# Patient Record
Sex: Female | Born: 1940 | Race: White | Hispanic: No | Smoking: Never smoker
Health system: Southern US, Community
[De-identification: ages and names within clinical notes are randomized; demographics above are authoritative.]

## PROBLEM LIST (undated history)

## (undated) DIAGNOSIS — I10 Essential (primary) hypertension: Secondary | ICD-10-CM

## (undated) DIAGNOSIS — G3184 Mild cognitive impairment, so stated: Secondary | ICD-10-CM

## (undated) DIAGNOSIS — F419 Anxiety disorder, unspecified: Secondary | ICD-10-CM

## (undated) DIAGNOSIS — E785 Hyperlipidemia, unspecified: Secondary | ICD-10-CM

## (undated) HISTORY — PX: BACK SURGERY: SHX140

## (undated) HISTORY — PX: ABDOMINAL HYSTERECTOMY: SHX81

## (undated) HISTORY — PX: CHOLECYSTECTOMY: SHX55

---

## 2012-01-23 ENCOUNTER — Emergency Department (INDEPENDENT_AMBULATORY_CARE_PROVIDER_SITE_OTHER): Payer: Medicare Other

## 2012-01-23 ENCOUNTER — Emergency Department (INDEPENDENT_AMBULATORY_CARE_PROVIDER_SITE_OTHER)
Admission: EM | Admit: 2012-01-23 | Discharge: 2012-01-23 | Disposition: A | Discharge status: Home / Self Care | Payer: Medicare Other | Source: Home / Self Care

## 2012-01-23 DIAGNOSIS — F419 Anxiety disorder, unspecified: Secondary | ICD-10-CM | POA: Insufficient documentation

## 2012-01-23 DIAGNOSIS — R05 Cough: Secondary | ICD-10-CM

## 2012-01-23 DIAGNOSIS — J40 Bronchitis, not specified as acute or chronic: Secondary | ICD-10-CM

## 2012-01-23 DIAGNOSIS — I152 Hypertension secondary to endocrine disorders: Secondary | ICD-10-CM | POA: Insufficient documentation

## 2012-01-23 DIAGNOSIS — J069 Acute upper respiratory infection, unspecified: Secondary | ICD-10-CM

## 2012-01-23 DIAGNOSIS — R062 Wheezing: Secondary | ICD-10-CM

## 2012-01-23 DIAGNOSIS — E785 Hyperlipidemia, unspecified: Secondary | ICD-10-CM | POA: Insufficient documentation

## 2012-01-23 DIAGNOSIS — I10 Essential (primary) hypertension: Secondary | ICD-10-CM | POA: Insufficient documentation

## 2012-01-23 HISTORY — DX: Hyperlipidemia, unspecified: E78.5

## 2012-01-23 HISTORY — DX: Essential (primary) hypertension: I10

## 2012-01-23 HISTORY — DX: Anxiety disorder, unspecified: F41.9

## 2012-01-23 MED ORDER — HYDROCOD POLST-CHLORPHEN POLST 10-8 MG/5ML PO LQCR: 5.0000 mL | Freq: Two times a day (BID) | ORAL | Status: DC | PRN

## 2012-01-23 MED ORDER — AZITHROMYCIN 250 MG PO TABS: ORAL_TABLET | ORAL | Status: DC

## 2012-01-23 MED ORDER — METHYLPREDNISOLONE ACETATE 80 MG/ML IJ SUSP
80.0000 mg | Freq: Once | INTRAMUSCULAR | Status: AC
  Administered 2012-01-23: 80 mg via INTRAMUSCULAR

## 2012-01-23 NOTE — ED Notes (Signed)
Kimberly Hines complains of a dry cough with some nasal congestion for 4 days. Denies fever, chills or sweats.

## 2012-01-23 NOTE — ED Provider Notes (Addendum)
History     CSN: [id number]  Arrival date & time 01/23/12  1545   None     Chief Complaint  Patient presents with  . Cough    x 4 days   HPI URI Symptoms Onset: 4 days Description: cough, post nasal drip, intermittent SOB Modifying factors:  Pt is having colonoscopy in 2 days. Is wondering if cough may affect procedure.   Symptoms Nasal discharge: post nasal drip Fever: no Sore throat: no Cough: yes Wheezing: yes; intermittent  Ear pain: no GI symptoms: no Sick contacts: unkown  Red Flags  Stiff neck: no Dyspnea: faint, exertional  Rash: no Swallowing difficulty: no  Sinusitis Risk Factors Headache/face pain: no Double sickening: no tooth pain: no  Allergy Risk Factors Sneezing: no Itchy scratchy throat: no Seasonal symptoms: no  Flu Risk Factors Headache: no muscle aches: no severe fatigue: no   Past Medical History  Diagnosis Date  . Hyperlipidemia   . Hypertension   . Anxiety     Past Surgical History  Procedure Date  . Abdominal hysterectomy   . Cholecystectomy     Family History  Problem Relation Age of Onset  . Alzheimer's disease Mother   . Cancer Father     Education officer, environmental    History  Substance Use Topics  . Smoking status: Never Smoker   . Smokeless tobacco: Never Used  . Alcohol Use: No    OB History    Grav Para Term Preterm Abortions TAB SAB Ect Mult Living                  Review of Systems  All other systems reviewed and are negative.    Allergies  Benicar  Home Medications   Current Outpatient Rx  Name Route Sig Dispense Refill  . AMLODIPINE BESYLATE 10 MG PO TABS Oral Take 10 mg by mouth daily.    Marland Kitchen CITALOPRAM HYDROBROMIDE 20 MG PO TABS Oral Take 20 mg by mouth daily.    Marland Kitchen ROSUVASTATIN CALCIUM 5 MG PO TABS Oral Take 5 mg by mouth daily.      BP 164/82  Pulse 75  Temp 98.3 F (36.8 C) (Oral)  Resp 16  Ht 5\' 2"  (1.575 m)  Wt 160 lb (72.576 kg)  BMI 29.26 kg/m2  SpO2 99%  Physical Exam    Constitutional: She appears well-developed and well-nourished.  HENT:  Head: Normocephalic and atraumatic.  Right Ear: External ear normal.  Left Ear: External ear normal.       +mild nasal erythema, rhinorrhea bilaterally, + post oropharyngeal erythema    Eyes: Conjunctivae are normal. Pupils are equal, round, and reactive to light.  Neck: Normal range of motion. Neck supple.  Cardiovascular: Normal rate and regular rhythm.   Pulmonary/Chest: Effort normal and breath sounds normal. She has no wheezes. She has no rales.  Abdominal: Soft.  Musculoskeletal: Normal range of motion.  Neurological: She is alert.  Skin: Skin is warm.    ED Course  Procedures   Labs Reviewed - No data to display Dg Chest 2 View  01/23/2012  *RADIOLOGY REPORT*  Clinical Data: Cough for the past 4 days.  CHEST - 2 VIEW  Comparison: None.  Findings: Normal sized heart.  Clear lungs.  The lungs are mildly hyperexpanded with mild central peribronchial thickening.  Thoracic spine degenerative changes.  Cholecystectomy clips.  IMPRESSION: Mild changes of COPD and chronic bronchitis.   Original Report Authenticated By: Darrol Angel, M.D.  1. URI (upper respiratory infection)   2. Bronchitis   3. Wheezing symptom       MDM  Suspect likely viral process as underlying etiology  Given intermittent faint wheezing; depomedrol 80mg  IM x 1 today. No wheezing on my lung exam today.  Prophylactic Rx for azithromycin given. Use if sxs not improved in 5-7 days.  Tussionex for cough.  Broached issued of colonoscopy. Told her to let staff know of sxs on day of procedure. Decision can be made at that time.  Infectious and respiratory red flags reviewed.  Handout deferred by pt.  Follow up as needed.      The patient and/or caregiver has been counseled thoroughly with regard to treatment plan and/or medications prescribed including dosage, schedule, interactions, rationale for use, and possible side effects and  they verbalize understanding. Diagnoses and expected course of recovery discussed and will return if not improved as expected or if the condition worsens. Patient and/or caregiver verbalized understanding.              Doree Albee, MD 01/23/12 1707  Doree Albee, MD 01/23/12 [phone]

## 2013-12-14 DIAGNOSIS — M5137 Other intervertebral disc degeneration, lumbosacral region: Secondary | ICD-10-CM | POA: Insufficient documentation

## 2013-12-14 DIAGNOSIS — M8580 Other specified disorders of bone density and structure, unspecified site: Secondary | ICD-10-CM | POA: Insufficient documentation

## 2013-12-14 DIAGNOSIS — M5430 Sciatica, unspecified side: Secondary | ICD-10-CM | POA: Insufficient documentation

## 2013-12-14 DIAGNOSIS — J45909 Unspecified asthma, uncomplicated: Secondary | ICD-10-CM | POA: Insufficient documentation

## 2013-12-14 DIAGNOSIS — R7303 Prediabetes: Secondary | ICD-10-CM | POA: Insufficient documentation

## 2013-12-20 DIAGNOSIS — F419 Anxiety disorder, unspecified: Secondary | ICD-10-CM | POA: Insufficient documentation

## 2014-08-06 DIAGNOSIS — H9191 Unspecified hearing loss, right ear: Secondary | ICD-10-CM | POA: Insufficient documentation

## 2015-01-28 DIAGNOSIS — Z13820 Encounter for screening for osteoporosis: Secondary | ICD-10-CM | POA: Insufficient documentation

## 2015-08-03 DIAGNOSIS — Z86010 Personal history of colonic polyps: Secondary | ICD-10-CM | POA: Insufficient documentation

## 2017-05-26 DIAGNOSIS — E119 Type 2 diabetes mellitus without complications: Secondary | ICD-10-CM

## 2017-05-26 HISTORY — DX: Type 2 diabetes mellitus without complications: E11.9

## 2017-08-19 DIAGNOSIS — M5416 Radiculopathy, lumbar region: Secondary | ICD-10-CM | POA: Insufficient documentation

## 2017-11-15 DIAGNOSIS — Z981 Arthrodesis status: Secondary | ICD-10-CM | POA: Insufficient documentation

## 2017-12-02 DIAGNOSIS — R7401 Elevation of levels of liver transaminase levels: Secondary | ICD-10-CM | POA: Insufficient documentation

## 2018-06-05 DIAGNOSIS — Z9189 Other specified personal risk factors, not elsewhere classified: Secondary | ICD-10-CM | POA: Insufficient documentation

## 2018-06-05 DIAGNOSIS — Z789 Other specified health status: Secondary | ICD-10-CM | POA: Insufficient documentation

## 2018-06-05 DIAGNOSIS — E538 Deficiency of other specified B group vitamins: Secondary | ICD-10-CM | POA: Insufficient documentation

## 2018-09-08 DIAGNOSIS — G3184 Mild cognitive impairment, so stated: Secondary | ICD-10-CM | POA: Insufficient documentation

## 2018-12-21 DIAGNOSIS — R748 Abnormal levels of other serum enzymes: Secondary | ICD-10-CM | POA: Insufficient documentation

## 2019-04-23 DIAGNOSIS — R059 Cough, unspecified: Secondary | ICD-10-CM | POA: Insufficient documentation

## 2019-04-23 DIAGNOSIS — J069 Acute upper respiratory infection, unspecified: Secondary | ICD-10-CM | POA: Insufficient documentation

## 2021-04-24 ENCOUNTER — Telehealth: Payer: Self-pay | Admitting: Physician Assistant

## 2021-04-24 ENCOUNTER — Other Ambulatory Visit: Payer: Self-pay | Admitting: Physician Assistant

## 2021-04-24 ENCOUNTER — Emergency Department (HOSPITAL_COMMUNITY): Payer: Medicare Other

## 2021-04-24 ENCOUNTER — Observation Stay: Payer: Medicare Other

## 2021-04-24 ENCOUNTER — Encounter (HOSPITAL_COMMUNITY): Payer: Self-pay

## 2021-04-24 ENCOUNTER — Observation Stay (HOSPITAL_COMMUNITY): Payer: Medicare Other

## 2021-04-24 ENCOUNTER — Observation Stay (HOSPITAL_COMMUNITY)
Admission: EM | Admit: 2021-04-24 | Discharge: 2021-04-24 | Disposition: A | Discharge status: Home / Self Care | Payer: Medicare Other | Attending: Internal Medicine | Admitting: Internal Medicine

## 2021-04-24 DIAGNOSIS — Z79899 Other long term (current) drug therapy: Secondary | ICD-10-CM | POA: Diagnosis not present

## 2021-04-24 DIAGNOSIS — I10 Essential (primary) hypertension: Secondary | ICD-10-CM | POA: Insufficient documentation

## 2021-04-24 DIAGNOSIS — S0181XA Laceration without foreign body of other part of head, initial encounter: Principal | ICD-10-CM | POA: Insufficient documentation

## 2021-04-24 DIAGNOSIS — I480 Paroxysmal atrial fibrillation: Secondary | ICD-10-CM

## 2021-04-24 DIAGNOSIS — R55 Syncope and collapse: Secondary | ICD-10-CM | POA: Diagnosis present

## 2021-04-24 DIAGNOSIS — E876 Hypokalemia: Secondary | ICD-10-CM | POA: Diagnosis not present

## 2021-04-24 DIAGNOSIS — E119 Type 2 diabetes mellitus without complications: Secondary | ICD-10-CM

## 2021-04-24 DIAGNOSIS — S0990XA Unspecified injury of head, initial encounter: Secondary | ICD-10-CM | POA: Diagnosis present

## 2021-04-24 DIAGNOSIS — Z23 Encounter for immunization: Secondary | ICD-10-CM | POA: Insufficient documentation

## 2021-04-24 DIAGNOSIS — W19XXXA Unspecified fall, initial encounter: Secondary | ICD-10-CM | POA: Insufficient documentation

## 2021-04-24 DIAGNOSIS — R9431 Abnormal electrocardiogram [ECG] [EKG]: Secondary | ICD-10-CM | POA: Diagnosis present

## 2021-04-24 DIAGNOSIS — Z20822 Contact with and (suspected) exposure to covid-19: Secondary | ICD-10-CM | POA: Insufficient documentation

## 2021-04-24 DIAGNOSIS — Z91040 Latex allergy status: Secondary | ICD-10-CM | POA: Diagnosis not present

## 2021-04-24 DIAGNOSIS — E785 Hyperlipidemia, unspecified: Secondary | ICD-10-CM | POA: Diagnosis present

## 2021-04-24 DIAGNOSIS — Z7984 Long term (current) use of oral hypoglycemic drugs: Secondary | ICD-10-CM | POA: Diagnosis not present

## 2021-04-24 DIAGNOSIS — I4891 Unspecified atrial fibrillation: Secondary | ICD-10-CM | POA: Diagnosis not present

## 2021-04-24 DIAGNOSIS — I152 Hypertension secondary to endocrine disorders: Secondary | ICD-10-CM | POA: Diagnosis present

## 2021-04-24 HISTORY — DX: Mild cognitive impairment of uncertain or unknown etiology: G31.84

## 2021-04-24 LAB — COMPREHENSIVE METABOLIC PANEL
ALT: 13 U/L (ref 0–44)
AST: 14 U/L — ABNORMAL LOW (ref 15–41)
Albumin: 3.7 g/dL (ref 3.5–5.0)
Alkaline Phosphatase: 75 U/L (ref 38–126)
Anion gap: 10 (ref 5–15)
BUN: 16 mg/dL (ref 8–23)
CO2: 19 mmol/L — ABNORMAL LOW (ref 22–32)
Calcium: 8.5 mg/dL — ABNORMAL LOW (ref 8.9–10.3)
Chloride: 107 mmol/L (ref 98–111)
Creatinine, Ser: 0.85 mg/dL (ref 0.44–1.00)
GFR, Estimated: >60 mL/min (ref >60)
Glucose, Bld: 166 mg/dL — ABNORMAL HIGH (ref 70–99)
Potassium: 2.6 mmol/L — CL (ref 3.5–5.1)
Sodium: 136 mmol/L (ref 135–145)
Total Bilirubin: 0.5 mg/dL (ref 0.3–1.2)
Total Protein: 6.7 g/dL (ref 6.5–8.1)

## 2021-04-24 LAB — CBC WITH DIFFERENTIAL/PLATELET
Abs Immature Granulocytes: 0.04 10*3/uL (ref 0.00–0.07)
Basophils Absolute: 0.1 10*3/uL (ref 0.0–0.1)
Basophils Relative: 1 %
Eosinophils Absolute: 0.1 10*3/uL (ref 0.0–0.5)
Eosinophils Relative: 1 %
HCT: 44 % (ref 36.0–46.0)
Hemoglobin: 14.2 g/dL (ref 12.0–15.0)
Immature Granulocytes: 0 %
Lymphocytes Relative: 21 %
Lymphs Abs: 1.9 10*3/uL (ref 0.7–4.0)
MCH: 29.5 pg (ref 26.0–34.0)
MCHC: 32.3 g/dL (ref 30.0–36.0)
MCV: 91.5 fL (ref 80.0–100.0)
Monocytes Absolute: 0.7 10*3/uL (ref 0.1–1.0)
Monocytes Relative: 8 %
Neutro Abs: 6.3 10*3/uL (ref 1.7–7.7)
Neutrophils Relative %: 69 %
Platelets: 241 10*3/uL (ref 150–400)
RBC: 4.81 MIL/uL (ref 3.87–5.11)
RDW: 13.4 % (ref 11.5–15.5)
WBC: 9.1 10*3/uL (ref 4.0–10.5)
nRBC: 0 % (ref 0.0–0.2)

## 2021-04-24 LAB — URINALYSIS, ROUTINE W REFLEX MICROSCOPIC
Bilirubin Urine: NEGATIVE
Glucose, UA: NEGATIVE mg/dL
Hgb urine dipstick: NEGATIVE
Ketones, ur: NEGATIVE mg/dL
Leukocytes,Ua: NEGATIVE
Nitrite: NEGATIVE
Protein, ur: NEGATIVE mg/dL
Specific Gravity, Urine: 1.017 (ref 1.005–1.030)
pH: 6 (ref 5.0–8.0)

## 2021-04-24 LAB — TROPONIN I (HIGH SENSITIVITY): Troponin I (High Sensitivity): 3 ng/L (ref <18)

## 2021-04-24 LAB — RESP PANEL BY RT-PCR (FLU A&B, COVID) ARPGX2
Influenza A by PCR: NEGATIVE
Influenza B by PCR: NEGATIVE
SARS Coronavirus 2 by RT PCR: NEGATIVE

## 2021-04-24 LAB — T4, FREE: Free T4: 1.27 ng/dL — ABNORMAL HIGH (ref 0.61–1.12)

## 2021-04-24 LAB — CBG MONITORING, ED: Glucose-Capillary: 174 mg/dL — ABNORMAL HIGH (ref 70–99)

## 2021-04-24 LAB — MAGNESIUM: Magnesium: 2.2 mg/dL (ref 1.7–2.4)

## 2021-04-24 LAB — TSH: TSH: 2.507 u[IU]/mL (ref 0.350–4.500)

## 2021-04-24 MED ORDER — SODIUM CHLORIDE 0.9 % IV BOLUS
1000.0000 mL | Freq: Once | INTRAVENOUS | Status: AC
  Administered 2021-04-24: 1000 mL via INTRAVENOUS

## 2021-04-24 MED ORDER — ACETAMINOPHEN 325 MG PO TABS: 650.0000 mg | ORAL_TABLET | Freq: Four times a day (QID) | ORAL | Status: DC | PRN

## 2021-04-24 MED ORDER — IOHEXOL 350 MG/ML SOLN
80.0000 mL | Freq: Once | INTRAVENOUS | Status: AC | PRN
  Administered 2021-04-24: 80 mL via INTRAVENOUS

## 2021-04-24 MED ORDER — ACETAMINOPHEN 650 MG RE SUPP: 650.0000 mg | Freq: Four times a day (QID) | RECTAL | Status: DC | PRN

## 2021-04-24 MED ORDER — POTASSIUM CHLORIDE 10 MEQ/100ML IV SOLN
10.0000 meq | INTRAVENOUS | Status: AC
  Administered 2021-04-24 (×2): 10 meq via INTRAVENOUS
  Filled 2021-04-24 (×2): qty 100

## 2021-04-24 MED ORDER — APIXABAN 5 MG PO TABS: 5.0000 mg | ORAL_TABLET | Freq: Two times a day (BID) | ORAL | 0 refills | Status: DC

## 2021-04-24 MED ORDER — ENOXAPARIN SODIUM 40 MG/0.4ML IJ SOSY: 40.0000 mg | PREFILLED_SYRINGE | INTRAMUSCULAR | Status: DC

## 2021-04-24 MED ORDER — ONDANSETRON HCL 4 MG/2ML IJ SOLN
4.0000 mg | Freq: Once | INTRAMUSCULAR | Status: AC
  Administered 2021-04-24: 4 mg via INTRAVENOUS
  Filled 2021-04-24: qty 2

## 2021-04-24 MED ORDER — TETANUS-DIPHTH-ACELL PERTUSSIS 5-2.5-18.5 LF-MCG/0.5 IM SUSY
0.5000 mL | PREFILLED_SYRINGE | Freq: Once | INTRAMUSCULAR | Status: AC
  Administered 2021-04-24: 0.5 mL via INTRAMUSCULAR
  Filled 2021-04-24: qty 0.5

## 2021-04-24 MED ORDER — DILTIAZEM HCL 25 MG/5ML IV SOLN
10.0000 mg | Freq: Once | INTRAVENOUS | Status: AC
  Administered 2021-04-24: 10 mg via INTRAVENOUS
  Filled 2021-04-24: qty 5

## 2021-04-24 MED ORDER — APIXABAN 5 MG PO TABS
5.0000 mg | ORAL_TABLET | Freq: Two times a day (BID) | ORAL | Status: DC
  Filled 2021-04-24: qty 1

## 2021-04-24 MED ORDER — POTASSIUM CHLORIDE CRYS ER 20 MEQ PO TBCR
40.0000 meq | EXTENDED_RELEASE_TABLET | Freq: Once | ORAL | Status: AC
  Administered 2021-04-24: 40 meq via ORAL
  Filled 2021-04-24: qty 2

## 2021-04-24 MED ORDER — POTASSIUM CHLORIDE IN NACL 40-0.9 MEQ/L-% IV SOLN
INTRAVENOUS | Status: DC
  Filled 2021-04-24: qty 1000

## 2021-04-24 MED ORDER — MAGNESIUM SULFATE 2 GM/50ML IV SOLN
2.0000 g | Freq: Once | INTRAVENOUS | Status: AC
  Administered 2021-04-24: 2 g via INTRAVENOUS
  Filled 2021-04-24: qty 50

## 2021-04-24 NOTE — Consult Note (Addendum)
Cardiology Consultation:   Patient ID: Kimberly Hines MRN: [id number]; DOB: 1940/10/30  Admit date: 04/24/2021 Date of Consult: 04/24/2021  PCP:  Lenox Ponds, MD   [facility] Providers Cardiologist:  New to cardiology Click here to update MD or APP on Care Team, Refresh:1}     Patient Profile:   Kimberly Hines is a 80 y.o. female with a hx of HTN, HLD, mild cognitive impairment, anxiety who is being seen 04/24/2021 for the evaluation of atrial fibrillation at the request of Dr. Robb Matar.  History of Present Illness:   Ms. Diprima has no prior cardiac history. She did have an ER visit at [facility]SH 10/2020 for nausea, vomiting, diarrhea, found to be hypokalemic. CareEverywhere says EKG showed NSR (do not have tracing for review).  Today she was at the hospital in the surgical waiting room with her husband because her son was here having hip surgery. She was feeling completely normal without any recent cardiac or GI symptoms. Had been eating and drinking normally. She went to stand up to go to the bathroom, was a little wobbly, and had to sit back down. She then went to the bathroom and while coming out, fell and struck her head. Her husband did not see the fall but came to her aid as soon as he realized. She does not remember anything right before the fall. She said she was completely awake when she hit the ground and was alert when her husband came to her side. She sustained a laceration to the left eyebrow. She had nausea and vomiting thereafter. In triage she was alert but slightly confused. She was found to be in rapid atrial fibrillation with prolonged QT interval and diffuse STT changes, with no prior EKGs to compare to. Initial BP was 140/86. Labs showed severe hypokalemia of 2.6, otherwise normal Mg, Cr, CBC, TSH, and troponin. Covid/flu negative. CXR showed minimal bibasilar atelectasis. CT head showed no acute fractures or hemorrhage. CT angio showed no evidence of PE, + mild  pulmonary edema in the lung bases with trace bilateral pleural effusions and bibasilar atelectasis.  She was treated with IV fluids, empiric IV magnesium, potassium, Zofran and 10mg  of diltiazem. She has since converted to NSR without significant post-conversion pauses or bradycardia. She currently feels well without complaint and denies any preceding cardiac symptoms before this admission.  Past Medical History:  Diagnosis Date   Anxiety    Hyperlipidemia    Hypertension    Mild cognitive impairment     Past Surgical History:  Procedure Laterality Date   ABDOMINAL HYSTERECTOMY     CHOLECYSTECTOMY       Home Medications:  Prior to Admission medications   Medication Sig Start Date End Date Taking? Authorizing Provider  amLODipine (NORVASC) 10 MG tablet Take 10 mg by mouth daily.    [provider]  azithromycin (ZITHROMAX) 250 MG tablet Take 2 tabs PO x 1 dose, then 1 tab PO QD x 4 days. USE IF SYMPTOMS NOT IMPROVED IN 5-7 DAYS 01/23/12   03/24/12, MD  citalopram (CELEXA) 20 MG tablet Take 20 mg by mouth daily.    [provider]  rosuvastatin (CRESTOR) 5 MG tablet Take 5 mg by mouth daily.    [provider]    Inpatient Medications: Scheduled Meds:  Continuous Infusions:  potassium chloride 10 mEq (04/24/21 1138)   PRN Meds: REM  Allergies:    Allergies  Allergen Reactions   Benicar [Olmesartan] Anaphylaxis and Nausea And  Vomiting   Diazepam Anaphylaxis   Latex Rash   Tape Rash    ACE BANDAGES    Social History:   Social History   Socioeconomic History   Marital status: Married    Spouse name: Not on file   Number of children: Not on file   Years of education: Not on file   Highest education level: Not on file  Occupational History   Not on file  Tobacco Use   Smoking status: Never   Smokeless tobacco: Never  Substance and Sexual Activity   Alcohol use: No   Drug use: No   Sexual activity: Not on file  Other Topics  Concern   Not on file  Social History Narrative   Not on file   Social Determinants of Health   Financial Resource Strain: Not on file  Food Insecurity: Not on file  Transportation Needs: Not on file  Physical Activity: Not on file  Stress: Not on file  Social Connections: Not on file  Intimate Partner Violence: Not on file    Family History:   Family History  Problem Relation Age of Onset   Alzheimer's disease Mother    Cancer Father        [place] Deputy worker   Coronary artery disease Paternal Aunt      ROS:  Please see the history of present illness.   All other ROS reviewed and negative.     Physical Exam/Data:   Vitals:   04/24/21 0845 04/24/21 0930 04/24/21 0945 04/24/21 1030  BP: 122/76 (!) 112/99 (!) 103/50 120/69  Pulse: (!) 136 (!) 114 (!) 113 70  Resp: 17 (!) 22 17 14   Temp:      TempSrc:      SpO2: 90% (!) 79% (!) 73% 94%   No intake or output data in the 24 hours ending 04/24/21 1219 Last 3 Weights 01/23/2012  Weight (lbs) 160 lb  Weight (kg) 72.576 kg     There is no height or weight on file to calculate BMI.  General: Well developed, well nourished WF, in no acute distress. Head: Normocephalic, sclera non-icteric, no xanthomas, nares are without discharge. S/p lac repair left eyebrow with mild ecchymosis Neck: Negative for carotid bruits. JVP not elevated. Lungs: Clear bilaterally to auscultation without wheezes, rales, or rhonchi. Breathing is unlabored. Heart: RRR S1 S2 without murmurs, rubs, or gallops.  Abdomen: Soft, non-tender, non-distended with normoactive bowel sounds. No rebound/guarding. Extremities: No clubbing or cyanosis. No edema. Distal pedal pulses are 2+ and equal bilaterally. Neuro: Alert and oriented X 3. Moves all extremities spontaneously. Psych:  Responds to questions appropriately with a normal affect.   EKG:  The EKG was personally reviewed and demonstrates:  atrial fib 113bpm, diffuse STT changes, QT prolongation  577ms  Telemetry:  Telemetry was personally reviewed and demonstrates:  atrial fib then NSR  Relevant CV Studies: None  Laboratory Data:  High Sensitivity Troponin:   Recent Labs  Lab 04/24/21 0836  TROPONINIHS 3     Chemistry Recent Labs  Lab 04/24/21 0836  NA 136  K 2.6*  CL 107  CO2 19*  GLUCOSE 166*  BUN 16  CREATININE 0.85  CALCIUM 8.5*  MG 2.2  GFRNONAA >60  ANIONGAP 10    Recent Labs  Lab 04/24/21 0836  PROT 6.7  ALBUMIN 3.7  AST 14*  ALT 13  ALKPHOS 75  BILITOT 0.5   Lipids No results for input(s): CHOL, TRIG, HDL, LABVLDL, LDLCALC, CHOLHDL in the last  168 hours.  Hematology Recent Labs  Lab 04/24/21 0836  WBC 9.1  RBC 4.81  HGB 14.2  HCT 44.0  MCV 91.5  MCH 29.5  MCHC 32.3  RDW 13.4  PLT 241   Thyroid  Recent Labs  Lab 04/24/21 0836  TSH 2.507    BNPNo results for input(s): BNP, PROBNP in the last 168 hours.  DDimer No results for input(s): DDIMER in the last 168 hours.   Radiology/Studies:  CT HEAD WO CONTRAST (5MM)  Result Date: 04/24/2021 CLINICAL DATA:  Fall, laceration above left eyebrow EXAM: CT HEAD WITHOUT CONTRAST CT CERVICAL SPINE WITHOUT CONTRAST TECHNIQUE: Multidetector CT imaging of the head and cervical spine was performed following the standard protocol without intravenous contrast. Multiplanar CT image reconstructions of the cervical spine were also generated. COMPARISON:  Brain MRI 12/15/2020 FINDINGS: CT HEAD FINDINGS Brain: There is no evidence of acute intracranial hemorrhage, extra-axial fluid collection, or acute infarct. There is mild global parenchymal volume loss. The ventricles are stable in size. Patchy hypodensity in the subcortical and periventricular white matter likely reflects sequela of chronic white matter microangiopathy. There is no solid mass lesion. There is no midline shift. Vascular: No hyperdense vessel or unexpected calcification. Skull: Normal. Negative for fracture or focal lesion.  Sinuses/Orbits: The imaged paranasal sinuses are clear. Bilateral lens implants are in place. The globes and orbits are otherwise unremarkable. Other: None. CT CERVICAL SPINE FINDINGS Alignment: Normal. Skull base and vertebrae: Skull base alignment is maintained. Vertebral body heights are preserved. There is no evidence of acute fracture. Soft tissues and spinal canal: No prevertebral fluid or swelling. No visible canal hematoma. Disc levels: There is multilevel intervertebral disc space narrowing with associated degenerative endplate change, most advanced at C5-C6 and C6-C7. There is relatively mild multilevel facet arthropathy. There is severe bilateral neural foraminal stenosis at C3-C4, on the right at C4-C5, and on the left at C5-C6 and C6-C7. Upper chest: There is mild scarring in the lung apices. Other: None. IMPRESSION: 1. No acute intracranial hemorrhage or calvarial fracture. 2. No acute fracture or traumatic malalignment of the cervical spine. 3. 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 Electronically Signed   By: Valetta Mole M.D.   On: 04/24/2021 09:30   CT Angio Chest PE W and/or Wo Contrast  Result Date: 04/24/2021 CLINICAL DATA:  Pulmonary embolism, rule out EXAM: CT ANGIOGRAPHY CHEST WITH CONTRAST TECHNIQUE: Multidetector CT imaging of the chest was performed using the standard protocol during bolus administration of intravenous contrast. Multiplanar CT image reconstructions and MIPs were obtained to evaluate the vascular anatomy. CONTRAST:  23mL OMNIPAQUE IOHEXOL 350 MG/ML SOLN COMPARISON:  None. FINDINGS: Cardiovascular: Satisfactory opacification of the pulmonary arteries to the segmental level. No evidence of pulmonary embolism. Normal heart size. No pericardial effusion. Mitral annular calcifications. Mild coronary artery calcifications. Mediastinum/Nodes: Prominent right hilar lymph node, likely reactive. No other mediastinal adenopathy. No axillary  lymphadenopathy. Thyroid is unremarkable. The esophagus is unremarkable. Lungs/Pleura: And the central airways are patent. There is mild diffuse bronchial wall thickening most prominent lung bases. Bibasilar atelectatic change, hypoventilatory change, and mild interlobular septal thickening with ground-glass opacities most prominent the lung bases. No focal airspace consolidation. No suspicious pulmonary nodules or masses. Trace bilateral pleural effusions. No pneumothorax. Upper Abdomen: No acute abnormality. Musculoskeletal: No acute osseous abnormality. No suspicious lytic or blastic lesions. Multilevel degenerative changes of the spine. Review of the MIP images confirms the above findings. IMPRESSION: No evidence of pulmonary embolism. Mild pulmonary  edema in the lung bases with trace bilateral pleural effusions and bibasilar atelectasis. Electronically Signed   By: Maurine Simmering M.D.   On: 04/24/2021 11:20   CT Cervical Spine Wo Contrast  Result Date: 04/24/2021 CLINICAL DATA:  Fall, laceration above left eyebrow EXAM: CT HEAD WITHOUT CONTRAST CT CERVICAL SPINE WITHOUT CONTRAST TECHNIQUE: Multidetector CT imaging of the head and cervical spine was performed following the standard protocol without intravenous contrast. Multiplanar CT image reconstructions of the cervical spine were also generated. COMPARISON:  Brain MRI 12/15/2020 FINDINGS: CT HEAD FINDINGS Brain: There is no evidence of acute intracranial hemorrhage, extra-axial fluid collection, or acute infarct. There is mild global parenchymal volume loss. The ventricles are stable in size. Patchy hypodensity in the subcortical and periventricular white matter likely reflects sequela of chronic white matter microangiopathy. There is no solid mass lesion. There is no midline shift. Vascular: No hyperdense vessel or unexpected calcification. Skull: Normal. Negative for fracture or focal lesion. Sinuses/Orbits: The imaged paranasal sinuses are clear.  Bilateral lens implants are in place. The globes and orbits are otherwise unremarkable. Other: None. CT CERVICAL SPINE FINDINGS Alignment: Normal. Skull base and vertebrae: Skull base alignment is maintained. Vertebral body heights are preserved. There is no evidence of acute fracture. Soft tissues and spinal canal: No prevertebral fluid or swelling. No visible canal hematoma. Disc levels: There is multilevel intervertebral disc space narrowing with associated degenerative endplate change, most advanced at C5-C6 and C6-C7. There is relatively mild multilevel facet arthropathy. There is severe bilateral neural foraminal stenosis at C3-C4, on the right at C4-C5, and on the left at C5-C6 and C6-C7. Upper chest: There is mild scarring in the lung apices. Other: None. IMPRESSION: 1. No acute intracranial hemorrhage or calvarial fracture. 2. No acute fracture or traumatic malalignment of the cervical spine. 3. 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 Electronically Signed   By: Valetta Mole M.D.   On: 04/24/2021 09:30   DG Chest Portable 1 View  Result Date: 04/24/2021 CLINICAL DATA:  Syncopal event, fell striking head on floor EXAM: PORTABLE CHEST 1 VIEW COMPARISON:  Portable exam 0826 hours compared to 10/29/2020 FINDINGS: Normal heart size, mediastinal contours, and pulmonary vascularity. Minimal subsegmental atelectasis at lung bases. Lungs otherwise clear. No infiltrate, pleural effusion or pneumothorax. Osseous structures demineralized. IMPRESSION: Minimal bibasilar atelectasis. Electronically Signed   By: Lavonia Dana M.D.   On: 04/24/2021 08:40     Assessment and Plan:   1. Fall vs syncope - patient does not recall the moments preceding the event but was awake upon hitting the ground - typically atrial fib itself would not cause syncope unless significant bradycardia or post-termination pauses, which have not been determined at this time - consider event monitor after  discharge which will also evaluate for afib burden - will order orthostatic VS although may be lower yield s/p IV fluids  2. Newly recognized transient atrial fibrillation - unclear relationship to #1 - we do not know if the atrial fib was pre-existing or merely transient in the context of physiologic stress of fall - spontaneously converted to NSR without significant post-termination pause or bradycardia - per preliminary discussion with Dr. Harl Bowie, plan to start IV heparin per pharmacy with transition to [place] prior to DC if tolerates this well and no additional procedures needed - follow on telemetry - TSH wnl - echo pending  3. Prolonged QT interval - suspect due to severe hypokalemia - avoid QT prolonging agents - repeat EKG now  that she is in NSR  4. Hypokalemia - unclear etiology, not on any diuretics or recent GI losses, being managed by primary team - recommend medical investigation of other causes  5. Hypoxia - CT findings as above - currently normal oxygenation on RA - follow clinically for now and await echo   Risk Assessment/Risk Scores:          CHA2DS2-VASc Score = 4   This indicates a 4.8% annual risk of stroke. The patient's score is based upon: CHF History: 0 HTN History: 1 Diabetes History: 0 Stroke History: 0 Vascular Disease History: 0 Age Score: 2 Gender Score: 1        For questions or updates, please contact [place] Please consult [email or web address] for contact info under    Signed, Charlie Pitter, PA-C  04/24/2021 12:19 PM

## 2021-04-24 NOTE — ED Triage Notes (Addendum)
Pt states she had gone to bathroom in surgical waiting room, came out and fell/syncopal event, struck head on floor. Laceration to above right eye brow, states she is unsure if she slipped or if she got dizzy. Does not remember event. Nauseated and vomited once during transport from surgical waiting room. C collar in place. Alert but slightly confused in triage, Aox2. Not on any blood thinners.

## 2021-04-24 NOTE — Telephone Encounter (Signed)
   Dr. Carolan Clines requests for 7 day non-live Zio be arranged for PAF/syncope. I called into patient's room at [facility] to confirm contact information and to let her know that the office will mail her this monitor. The patient prefers to subsequently follow up at [facility]. I did add our contact info to her AVS if she has any questions. Will forward this message to our monitor team to help arrange monitor. Wreatha Sturgeon PA-C

## 2021-04-24 NOTE — ED Provider Notes (Signed)
[facility] DEPT Provider Note   CSN: [id number] Arrival date & time: 04/24/21  0736     History Chief Complaint  Patient presents with   Fall   Loss of Consciousness    Kimberly Hines is a 80 y.o. female.  The history is provided by the patient.  Loss of Consciousness Episode history:  Single Most recent episode:  Today Progression:  Resolved Chronicity:  New Context: normal activity   Relieved by:  Nothing Worsened by:  Nothing Associated symptoms: dizziness (lightheaded)   Associated symptoms: no anxiety, no chest pain, no confusion, no diaphoresis, no difficulty breathing, no fever, no palpitations, no seizures, no shortness of breath and no vomiting       Past Medical History:  Diagnosis Date   Anxiety    Hyperlipidemia    Hypertension     Patient Active Problem List   Diagnosis Date Noted   Syncope and collapse 04/24/2021   Hyperlipidemia    Hypertension    Anxiety     Past Surgical History:  Procedure Laterality Date   ABDOMINAL HYSTERECTOMY     CHOLECYSTECTOMY       OB History   No obstetric history on file.     Family History  Problem Relation Age of Onset   Alzheimer's disease Mother    Cancer Father        [employer] logistics/support/administrative officer    Social History   Tobacco Use   Smoking status: Never   Smokeless tobacco: Never  Substance Use Topics   Alcohol use: No   Drug use: No    Home Medications Prior to Admission medications   Medication Sig Start Date End Date Taking? Authorizing Provider  amLODipine (NORVASC) 10 MG tablet Take 10 mg by mouth daily.    [provider]  azithromycin (ZITHROMAX) 250 MG tablet Take 2 tabs PO x 1 dose, then 1 tab PO QD x 4 days. USE IF SYMPTOMS NOT IMPROVED IN 5-7 DAYS 01/23/12   Deneise Lever, MD  chlorpheniramine-HYDROcodone [facility] PENNKINETIC ER) 10-8 MG/5ML LQCR Take 5 mLs by mouth every 12 (twelve) hours as needed (cough). 01/23/12   Deneise Lever, MD  citalopram  (CELEXA) 20 MG tablet Take 20 mg by mouth daily.    [provider]  rosuvastatin (CRESTOR) 5 MG tablet Take 5 mg by mouth daily.    [provider]    Allergies    Benicar [olmesartan] and Latex  Review of Systems   Review of Systems  Constitutional:  Negative for chills, diaphoresis and fever.  HENT:  Negative for ear pain and sore throat.   Eyes:  Negative for pain and visual disturbance.  Respiratory:  Negative for cough and shortness of breath.   Cardiovascular:  Positive for syncope. Negative for chest pain and palpitations.  Gastrointestinal:  Negative for abdominal pain and vomiting.  Genitourinary:  Negative for dysuria and hematuria.  Musculoskeletal:  Negative for arthralgias and back pain.  Skin:  Positive for wound. Negative for color change and rash.  Neurological:  Positive for dizziness (lightheaded) and syncope. Negative for seizures.  Psychiatric/Behavioral:  Negative for confusion.   All other systems reviewed and are negative.  Physical Exam Updated Vital Signs  ED Triage Vitals [04/24/21 0740]  Enc Vitals Group     BP 140/86     Pulse Rate 98     Resp 18     Temp (!) 97.5 F (36.4 C)     Temp Source Oral  SpO2 100 %     Weight      Height      Head Circumference      Peak Flow      Pain Score      Pain Loc      Pain Edu?      Excl. in GC?     Physical Exam Vitals and nursing note reviewed.  Constitutional:      General: She is not in acute distress.    Appearance: She is well-developed.  HENT:     Head:     Comments: Laceration to left eye brow, hemostatic     Nose: Nose normal.  Eyes:     Extraocular Movements: Extraocular movements intact.     Conjunctiva/sclera: Conjunctivae normal.     Pupils: Pupils are equal, round, and reactive to light.  Cardiovascular:     Rate and Rhythm: Tachycardia present. Rhythm irregular.     Pulses: Normal pulses.     Heart sounds: Normal heart sounds. No murmur heard. Pulmonary:      Effort: Pulmonary effort is normal. No respiratory distress.     Breath sounds: Normal breath sounds.  Abdominal:     Palpations: Abdomen is soft.     Tenderness: There is no abdominal tenderness.  Musculoskeletal:        General: No swelling. Normal range of motion.     Cervical back: Neck supple.  Skin:    General: Skin is warm and dry.     Capillary Refill: Capillary refill takes less than 2 seconds.  Neurological:     General: No focal deficit present.     Mental Status: She is alert and oriented to person, place, and time.     Cranial Nerves: No cranial nerve deficit.     Sensory: No sensory deficit.     Motor: No weakness.     Coordination: Coordination normal.     Comments: 5+ out of 5 strength, normal sensation, no drift, normal finger-to-nose finger  Psychiatric:        Mood and Affect: Mood normal.    ED Results / Procedures / Treatments   Labs (all labs ordered are listed, but only abnormal results are displayed) Labs Reviewed  COMPREHENSIVE METABOLIC PANEL - Abnormal; Notable for the following components:      Result Value   Potassium 2.6 (*)    CO2 19 (*)    Glucose, Bld 166 (*)    Calcium 8.5 (*)    AST 14 (*)    All other components within normal limits  CBG MONITORING, ED - Abnormal; Notable for the following components:   Glucose-Capillary 174 (*)    All other components within normal limits  RESP PANEL BY RT-PCR (FLU A&B, COVID) ARPGX2  CBC WITH DIFFERENTIAL/PLATELET  MAGNESIUM  TSH  T4, FREE  URINALYSIS, ROUTINE W REFLEX MICROSCOPIC  TROPONIN I (HIGH SENSITIVITY)  TROPONIN I (HIGH SENSITIVITY)    EKG EKG Interpretation  Date/Time:  Friday April 24 2021 07:41:18 EST Ventricular Rate:  113 PR Interval:    QRS Duration: 91 QT Interval:  386 QTC Calculation: 530 R Axis:   53 Text Interpretation: Atrial fibrillation ST depr, consider ischemia, inferior leads Prolonged QT interval Confirmed by Virgina Norfolk ([id number]) on 04/24/2021 9:08:51  AM  Radiology CT HEAD WO CONTRAST ( )  Result Date: 04/24/2021 CLINICAL DATA:  Fall, laceration above left eyebrow EXAM: CT HEAD WITHOUT CONTRAST CT CERVICAL SPINE WITHOUT CONTRAST TECHNIQUE: Multidetector CT imaging of the head and  cervical spine was performed following the standard protocol without intravenous contrast. Multiplanar CT image reconstructions of the cervical spine were also generated. COMPARISON:  Brain MRI 12/15/2020 FINDINGS: CT HEAD FINDINGS Brain: There is no evidence of acute intracranial hemorrhage, extra-axial fluid collection, or acute infarct. There is mild global parenchymal volume loss. The ventricles are stable in size. Patchy hypodensity in the subcortical and periventricular white matter likely reflects sequela of chronic white matter microangiopathy. There is no solid mass lesion. There is no midline shift. Vascular: No hyperdense vessel or unexpected calcification. Skull: Normal. Negative for fracture or focal lesion. Sinuses/Orbits: The imaged paranasal sinuses are clear. Bilateral lens implants are in place. The globes and orbits are otherwise unremarkable. Other: None. CT CERVICAL SPINE FINDINGS Alignment: Normal. Skull base and vertebrae: Skull base alignment is maintained. Vertebral body heights are preserved. There is no evidence of acute fracture. Soft tissues and spinal canal: No prevertebral fluid or swelling. No visible canal hematoma. Disc levels: There is multilevel intervertebral disc space narrowing with associated degenerative endplate change, most advanced at C5-C6 and C6-C7. There is relatively mild multilevel facet arthropathy. There is severe bilateral neural foraminal stenosis at C3-C4, on the right at C4-C5, and on the left at C5-C6 and C6-C7. Upper chest: There is mild scarring in the lung apices. Other: None. IMPRESSION: 1. No acute intracranial hemorrhage or calvarial fracture. 2. No acute fracture or traumatic malalignment of the cervical spine. 3.  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 Electronically Signed   By: Valetta Mole M.D.   On: 04/24/2021 09:30   CT Cervical Spine Wo Contrast  Result Date: 04/24/2021 CLINICAL DATA:  Fall, laceration above left eyebrow EXAM: CT HEAD WITHOUT CONTRAST CT CERVICAL SPINE WITHOUT CONTRAST TECHNIQUE: Multidetector CT imaging of the head and cervical spine was performed following the standard protocol without intravenous contrast. Multiplanar CT image reconstructions of the cervical spine were also generated. COMPARISON:  Brain MRI 12/15/2020 FINDINGS: CT HEAD FINDINGS Brain: There is no evidence of acute intracranial hemorrhage, extra-axial fluid collection, or acute infarct. There is mild global parenchymal volume loss. The ventricles are stable in size. Patchy hypodensity in the subcortical and periventricular white matter likely reflects sequela of chronic white matter microangiopathy. There is no solid mass lesion. There is no midline shift. Vascular: No hyperdense vessel or unexpected calcification. Skull: Normal. Negative for fracture or focal lesion. Sinuses/Orbits: The imaged paranasal sinuses are clear. Bilateral lens implants are in place. The globes and orbits are otherwise unremarkable. Other: None. CT CERVICAL SPINE FINDINGS Alignment: Normal. Skull base and vertebrae: Skull base alignment is maintained. Vertebral body heights are preserved. There is no evidence of acute fracture. Soft tissues and spinal canal: No prevertebral fluid or swelling. No visible canal hematoma. Disc levels: There is multilevel intervertebral disc space narrowing with associated degenerative endplate change, most advanced at C5-C6 and C6-C7. There is relatively mild multilevel facet arthropathy. There is severe bilateral neural foraminal stenosis at C3-C4, on the right at C4-C5, and on the left at C5-C6 and C6-C7. Upper chest: There is mild scarring in the lung apices. Other: None. IMPRESSION: 1.  No acute intracranial hemorrhage or calvarial fracture. 2. No acute fracture or traumatic malalignment of the cervical spine. 3. 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 Electronically Signed   By: Valetta Mole M.D.   On: 04/24/2021 09:30   DG Chest Portable 1 View  Result Date: 04/24/2021 CLINICAL DATA:  Syncopal event, fell striking head on floor  EXAM: PORTABLE CHEST 1 VIEW COMPARISON:  Portable exam 0826 hours compared to 10/29/2020 FINDINGS: Normal heart size, mediastinal contours, and pulmonary vascularity. Minimal subsegmental atelectasis at lung bases. Lungs otherwise clear. No infiltrate, pleural effusion or pneumothorax. Osseous structures demineralized. IMPRESSION: Minimal bibasilar atelectasis. Electronically Signed   By: Lavonia Dana M.D.   On: 04/24/2021 08:40    Procedures .Marland KitchenLaceration Repair  Date/Time: 04/24/2021 9:05 AM Performed by: Lennice Sites, DO Authorized by: Lennice Sites, DO   Consent:    Consent obtained:  Verbal   Consent given by:  Patient   Risks, benefits, and alternatives were discussed: yes     Risks discussed:  Infection, need for additional repair, nerve damage, pain, poor cosmetic result and poor wound healing   Alternatives discussed:  No treatment Universal protocol:    Procedure explained and questions answered to patient or proxy's satisfaction: yes     Relevant documents present and verified: yes     Patient identity confirmed:  Verbally with patient Anesthesia:    Anesthesia method:  None Laceration details:    Location: left eyebrown.   Length (cm):  2   Depth (mm):  1 Pre-procedure details:    Preparation:  Patient was prepped and draped in usual sterile fashion Exploration:    Hemostasis achieved with:  Direct pressure   Imaging outcome: foreign body not noted     Wound exploration: wound explored through full range of motion and entire depth of wound visualized     Wound extent: no areolar tissue violation  noted, no fascia violation noted, no foreign bodies/material noted, no muscle damage noted, no nerve damage noted, no tendon damage noted, no underlying fracture noted and no vascular damage noted     Contaminated: no   Treatment:    Area cleansed with:  Shur-Clens   Amount of cleaning:  Standard   Debridement:  None   Undermining:  None   Scar revision: no   Skin repair:    Repair method:  Steri-Strips and tissue adhesive   Number of Steri-Strips:  3 Approximation:    Approximation:  Close Repair type:    Repair type:  Simple Post-procedure details:    Dressing:  Open (no dressing) .Critical Care Performed by: Lennice Sites, DO Authorized by: Lennice Sites, DO   Critical care provider statement:    Critical care time (minutes):  35   Critical care was necessary to treat or prevent imminent or life-threatening deterioration of the following conditions: atrial fibrilattion with rvr.   Critical care was time spent personally by me on the following activities:  Blood draw for specimens, development of treatment plan with patient or surrogate, discussions with consultants, evaluation of patient's response to treatment, examination of patient, interpretation of cardiac output measurements, obtaining history from patient or surrogate, ordering and performing treatments and interventions, ordering and review of laboratory studies, ordering and review of radiographic studies, re-evaluation of patient's condition, pulse oximetry and review of old charts   I assumed direction of critical care for this patient from another provider in my specialty: no     Care discussed with: admitting provider     Medications Ordered in ED Medications  potassium chloride 10 mEq in 100 mL IVPB (has no administration in time range)  potassium chloride SA (KLOR-CON M) CR tablet 40 mEq (has no administration in time range)  Tdap (BOOSTRIX) injection 0.5 mL (has no administration in time range)  magnesium sulfate  IVPB 2 g 50 mL (has no administration in  time range)  sodium chloride 0.9 % bolus 1,000 mL (1,000 mLs Intravenous New Bag/Given 04/24/21 0840)  ondansetron (ZOFRAN) injection 4 mg (4 mg Intravenous Given 04/24/21 0918)  diltiazem (CARDIZEM) injection 10 mg (10 mg Intravenous Given 04/24/21 0930)    ED Course  I have reviewed the triage vital signs and the nursing notes.  Pertinent labs & imaging results that were available during my care of the patient were reviewed by me and considered in my medical decision making (see chart for details).    MDM Rules/Calculators/A&P                           Kimberly Hines is a 80 year old female with history of high cholesterol, hypertension who presents the ED after syncopal episode.  Patient found to be in A. fib with RVR.  Vital signs otherwise unremarkable.  Patient given dose of IV diltiazem, heart rate more controlled in the 80s and 90s.  Blood pressure in the low 100s.  We will hold off on continuous infusion at this time as she appears to be somewhat rate controlled.  Patient was actually at the hospital today awaiting family member who is having surgery.  She went to go use the bathroom and the next thing she knew she passed out.  EKG showed A. fib with RVR which is new.  This has responded to an IV push of diltiazem heart rate now more consistently in the 80s and 90s.  However she has had some borderline hypoxia while here and will pursue PE study as work-up otherwise is unremarkable except for potassium of 2.6 which has been repleted.  She has no significant kidney injury.  Troponin is normal.  Head and neck CT are unremarkable.  Chest x-ray without infection.  Laceration was repaired with Dermabond.  CT scan has been ordered to rule out PE.  Hemodynamically she appears to be stable will be admitted to medicine team for further care.  This chart was dictated using voice recognition software.  Despite best efforts to proofread,  errors can occur which  can change the documentation meaning.   Final Clinical Impression(s) / ED Diagnoses Final diagnoses:  Atrial fibrillation with RVR ([place])  Syncope and collapse  Facial laceration, initial encounter    Rx / DC Orders ED Discharge Orders     None        Lennice Sites, DO 04/24/21 1017

## 2021-04-24 NOTE — Discharge Summary (Signed)
Physician Discharge Summary  Kimberly Hines [id number] DOB: 06/03/1940 DOA: 04/24/2021  PCP: Doreatha Lew, MD  Admit date: 04/24/2021 Discharge date: 04/24/2021  Admitted From: Home.  Disposition: Home.  Recommendations for Outpatient Follow-up:  Follow up with PCP in 1-2 weeks Please obtain BMP/CBC in one week Please follow up on the following pending results:  Home Health: No. Equipment/Devices: None. Discharge Condition: Stable. CODE STATUS: Full code. Diet recommendation: Heart Healthy / Carb Modified.  Brief/Interim Summary: 80 y.o. female with medical history significant of anxiety, hyperlipidemia, hypertension, mild cognitive impairment who was seen today at this facility surgical waiting room with her husband while her son was having hip surgery.  She stood up to go to the bathroom, felt lightheaded and had to sit back down.  She subsequently was able to go to the bathroom and when she was coming out fell on the floor hitting her head.  Her husband did not see her fall but came to assist her.  She had nausea and vomiting afterwards.  She was mildly confused while she was in triage but this has since then resolved.    Discharge Diagnoses:  Principal Problem:   Syncope and collapse Observation/PCU. Continue IV fluids. Check carotid Doppler. Check echocardiogram. Cardiology consult appreciated.   Active Problems:   Atrial fibrillation with RVR. Resolved. Converted to sinus. Cardiology will follow as an outpatient Will discharge the patient home on Eliquis.     Prolonged Q-T interval on ECG Magnesium and potassium were supplemented.     Type 2 diabetes mellitus without complication,  without long-term current use of insulin (HCC) Carbohydrate modified diet. Continue metformin 500 mg p.o. daily. CBG monitoring before meals and bedtime.     Hyperlipidemia Continue atorvastatin 10 mg p.o. daily.     Hypertension Continue amlodipine 10 mg p.o. daily.      Hypokalemia Replaced. Follow-up potassium level.     Hypocalcemia Follow-up calcium level. Further work-up depending on results.  Discharge Instructions  Discharge Instructions     [facility] referral to [facility]   Complete by: As directed       Allergies as of 04/24/2021       Reactions   Benicar [olmesartan] Anaphylaxis, Nausea And Vomiting   Diazepam Anaphylaxis   Latex Rash   Tape Rash   ACE BANDAGES        Medication List     TAKE these medications    amLODipine 10 MG tablet Commonly known as: NORVASC Take 10 mg by mouth daily.   apixaban 5 MG Tabs tablet Commonly known as: ELIQUIS Take 1 tablet (5 mg total) by mouth 2 (two) times daily.   atorvastatin 10 MG tablet Commonly known as: LIPITOR Take 10 mg by mouth daily.   busPIRone 5 MG tablet Commonly known as: BUSPAR Take 5 mg by mouth 2 (two) times daily.   citalopram 20 MG tablet Commonly known as: CELEXA Take 20 mg by mouth daily.   meclizine 25 MG tablet Commonly known as: ANTIVERT Take 1 tablet by mouth 3 (three) times daily as needed for dizziness or nausea.   metFORMIN 500 MG 24 hr tablet Commonly known as: GLUCOPHAGE-XR Take 500 mg by mouth daily with supper.   rivastigmine 3 MG capsule Commonly known as: EXELON Take 3 mg by mouth 2 (two) times daily.   vitamin B-12 250 MCG tablet Commonly known as: CYANOCOBALAMIN Take 250 mcg by mouth daily.        Follow-up Information     Branch,  Royetta Crochet, MD Follow up.   Specialty: Cardiology Why: [facility] - the cardiology office will be mailing you a heart monitor to wear for 7 days. It will come with instructions for use. Please call the office listed here if you have any questions about your cardiology plan of care or heart monitor. Contact information: [address] [place] [address] [phone]                Allergies  Allergen Reactions   Benicar [Olmesartan] Anaphylaxis and Nausea And Vomiting    Diazepam Anaphylaxis   Latex Rash   Tape Rash    ACE BANDAGES    Consultations: [facility].  Procedures/Studies: CT HEAD WO CONTRAST (5MM)  Result Date: 04/24/2021 CLINICAL DATA:  Fall, laceration above left eyebrow EXAM: CT HEAD WITHOUT CONTRAST CT CERVICAL SPINE WITHOUT CONTRAST TECHNIQUE: Multidetector CT imaging of the head and cervical spine was performed following the standard protocol without intravenous contrast. Multiplanar CT image reconstructions of the cervical spine were also generated. COMPARISON:  Brain MRI 12/15/2020 FINDINGS: CT HEAD FINDINGS Brain: There is no evidence of acute intracranial hemorrhage, extra-axial fluid collection, or acute infarct. There is mild global parenchymal volume loss. The ventricles are stable in size. Patchy hypodensity in the subcortical and periventricular white matter likely reflects sequela of chronic white matter microangiopathy. There is no solid mass lesion. There is no midline shift. Vascular: No hyperdense vessel or unexpected calcification. Skull: Normal. Negative for fracture or focal lesion. Sinuses/Orbits: The imaged paranasal sinuses are clear. Bilateral lens implants are in place. The globes and orbits are otherwise unremarkable. Other: None. CT CERVICAL SPINE FINDINGS Alignment: Normal. Skull base and vertebrae: Skull base alignment is maintained. Vertebral body heights are preserved. There is no evidence of acute fracture. Soft tissues and spinal canal: No prevertebral fluid or swelling. No visible canal hematoma. Disc levels: There is multilevel intervertebral disc space narrowing with associated degenerative endplate change, most advanced at C5-C6 and C6-C7. There is relatively mild multilevel facet arthropathy. There is severe bilateral neural foraminal stenosis at C3-C4, on the right at C4-C5, and on the left at C5-C6 and C6-C7. Upper chest: There is mild scarring in the lung apices. Other: None. IMPRESSION: 1. No acute intracranial  hemorrhage or calvarial fracture. 2. No acute fracture or traumatic malalignment of the cervical spine. 3. 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 Electronically Signed   By: Valetta Mole M.D.   On: 04/24/2021 09:30   CT Angio Chest PE W and/or Wo Contrast  Result Date: 04/24/2021 CLINICAL DATA:  Pulmonary embolism, rule out EXAM: CT ANGIOGRAPHY CHEST WITH CONTRAST TECHNIQUE: Multidetector CT imaging of the chest was performed using the standard protocol during bolus administration of intravenous contrast. Multiplanar CT image reconstructions and MIPs were obtained to evaluate the vascular anatomy. CONTRAST:  50mL OMNIPAQUE IOHEXOL 350 MG/ML SOLN COMPARISON:  None. FINDINGS: Cardiovascular: Satisfactory opacification of the pulmonary arteries to the segmental level. No evidence of pulmonary embolism. Normal heart size. No pericardial effusion. Mitral annular calcifications. Mild coronary artery calcifications. Mediastinum/Nodes: Prominent right hilar lymph node, likely reactive. No other mediastinal adenopathy. No axillary lymphadenopathy. Thyroid is unremarkable. The esophagus is unremarkable. Lungs/Pleura: And the central airways are patent. There is mild diffuse bronchial wall thickening most prominent lung bases. Bibasilar atelectatic change, hypoventilatory change, and mild interlobular septal thickening with ground-glass opacities most prominent the lung bases. No focal airspace consolidation. No suspicious pulmonary nodules or masses. Trace bilateral pleural effusions. No  pneumothorax. Upper Abdomen: No acute abnormality. Musculoskeletal: No acute osseous abnormality. No suspicious lytic or blastic lesions. Multilevel degenerative changes of the spine. Review of the MIP images confirms the above findings. IMPRESSION: No evidence of pulmonary embolism. Mild pulmonary edema in the lung bases with trace bilateral pleural effusions and bibasilar atelectasis. Electronically  Signed   By: Caprice Renshaw M.D.   On: 04/24/2021 11:20   CT Cervical Spine Wo Contrast  Result Date: 04/24/2021 CLINICAL DATA:  Fall, laceration above left eyebrow EXAM: CT HEAD WITHOUT CONTRAST CT CERVICAL SPINE WITHOUT CONTRAST TECHNIQUE: Multidetector CT imaging of the head and cervical spine was performed following the standard protocol without intravenous contrast. Multiplanar CT image reconstructions of the cervical spine were also generated. COMPARISON:  Brain MRI 12/15/2020 FINDINGS: CT HEAD FINDINGS Brain: There is no evidence of acute intracranial hemorrhage, extra-axial fluid collection, or acute infarct. There is mild global parenchymal volume loss. The ventricles are stable in size. Patchy hypodensity in the subcortical and periventricular white matter likely reflects sequela of chronic white matter microangiopathy. There is no solid mass lesion. There is no midline shift. Vascular: No hyperdense vessel or unexpected calcification. Skull: Normal. Negative for fracture or focal lesion. Sinuses/Orbits: The imaged paranasal sinuses are clear. Bilateral lens implants are in place. The globes and orbits are otherwise unremarkable. Other: None. CT CERVICAL SPINE FINDINGS Alignment: Normal. Skull base and vertebrae: Skull base alignment is maintained. Vertebral body heights are preserved. There is no evidence of acute fracture. Soft tissues and spinal canal: No prevertebral fluid or swelling. No visible canal hematoma. Disc levels: There is multilevel intervertebral disc space narrowing with associated degenerative endplate change, most advanced at C5-C6 and C6-C7. There is relatively mild multilevel facet arthropathy. There is severe bilateral neural foraminal stenosis at C3-C4, on the right at C4-C5, and on the left at C5-C6 and C6-C7. Upper chest: There is mild scarring in the lung apices. Other: None. IMPRESSION: 1. No acute intracranial hemorrhage or calvarial fracture. 2. No acute fracture or traumatic  malalignment of the cervical spine. 3. 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 Electronically Signed   By: Lesia Hausen M.D.   On: 04/24/2021 09:30   DG Chest Portable 1 View  Result Date: 04/24/2021 CLINICAL DATA:  Syncopal event, fell striking head on floor EXAM: PORTABLE CHEST 1 VIEW COMPARISON:  Portable exam 0826 hours compared to 10/29/2020 FINDINGS: Normal heart size, mediastinal contours, and pulmonary vascularity. Minimal subsegmental atelectasis at lung bases. Lungs otherwise clear. No infiltrate, pleural effusion or pneumothorax. Osseous structures demineralized. IMPRESSION: Minimal bibasilar atelectasis. Electronically Signed   By: Ulyses Southward M.D.   On: 04/24/2021 08:40   (Echo, Carotid, EGD, Colonoscopy, ERCP)    Subjective:   Discharge Exam: Vitals:   04/24/21 1500 04/24/21 1515  BP: 114/70 119/69  Pulse: (!) 56 (!) 57  Resp:    Temp:    SpO2: 90% 98%   General: Pt is alert, awake, not in acute distress Cardiovascular: RRR, S1/S2 +, no rubs, no gallops Respiratory: CTA bilaterally, no wheezing, no rhonchi Abdominal: Soft, NT, ND, bowel sounds + Extremities: no edema, no cyanosis  The results of significant diagnostics from this hospitalization (including imaging, microbiology, ancillary and laboratory) are listed below for reference.    Microbiology: Recent Results (from the past 240 hour(s))  Resp Panel by RT-PCR (Flu A&B, Covid) Nasopharyngeal Swab     Status: None   Collection Time: 04/24/21  8:16 AM   Specimen: Nasopharyngeal Swab; Nasopharyngeal(NP) swabs  in vial transport medium  Result Value Ref Range Status   SARS Coronavirus 2 by RT PCR NEGATIVE NEGATIVE Final    Comment: (NOTE) SARS-CoV-2 target nucleic acids are NOT DETECTED.  The SARS-CoV-2 RNA is generally detectable in upper respiratory specimens during the acute phase of infection. The lowest concentration of SARS-CoV-2 viral copies this assay can detect is 138  copies/mL. A negative result does not preclude SARS-Cov-2 infection and should not be used as the sole basis for treatment or other patient management decisions. A negative result may occur with  improper specimen collection/handling, submission of specimen other than nasopharyngeal swab, presence of viral mutation(s) within the areas targeted by this assay, and inadequate number of viral copies(<138 copies/mL). A negative result must be combined with clinical observations, patient history, and epidemiological information. The expected result is Negative.  Fact Sheet for Patients:  EntrepreneurPulse.com.au  Fact Sheet for Healthcare Providers:  IncredibleEmployment.be  This test is no t yet approved or cleared by the [place] FDA and  has been authorized for detection and/or diagnosis of SARS-CoV-2 by FDA under an Emergency Use Authorization (EUA). This EUA will remain  in effect (meaning this test can be used) for the duration of the COVID-19 declaration under Section 564(b)(1) of the Act, 21 U.S.C.section 360bbb-3(b)(1), unless the authorization is terminated  or revoked sooner.       Influenza A by PCR NEGATIVE NEGATIVE Final   Influenza B by PCR NEGATIVE NEGATIVE Final    Comment: (NOTE) The Xpert Xpress SARS-CoV-2/FLU/RSV plus assay is intended as an aid in the diagnosis of influenza from Nasopharyngeal swab specimens and should not be used as a sole basis for treatment. Nasal washings and aspirates are unacceptable for Xpert Xpress SARS-CoV-2/FLU/RSV testing.  Fact Sheet for Patients: EntrepreneurPulse.com.au  Fact Sheet for Healthcare Providers: IncredibleEmployment.be  This test is not yet approved or cleared by the [place] FDA and has been authorized for detection and/or diagnosis of SARS-CoV-2 by FDA under an Emergency Use Authorization (EUA). This EUA will remain in effect (meaning  this test can be used) for the duration of the COVID-19 declaration under Section 564(b)(1) of the Act, 21 U.S.C. section 360bbb-3(b)(1), unless the authorization is terminated or revoked.  Performed at [facility], [place] [address] [place], [place] [address]      Labs: BNP (last 3 results) No results for input(s): BNP in the last 8760 hours. Basic Metabolic Panel: Recent Labs  Lab 04/24/21 0836  NA 136  K 2.6*  CL 107  CO2 19*  GLUCOSE 166*  BUN 16  CREATININE 0.85  CALCIUM 8.5*  MG 2.2   Liver Function Tests: Recent Labs  Lab 04/24/21 0836  AST 14*  ALT 13  ALKPHOS 75  BILITOT 0.5  PROT 6.7  ALBUMIN 3.7   No results for input(s): LIPASE, AMYLASE in the last 168 hours. No results for input(s): AMMONIA in the last 168 hours. CBC: Recent Labs  Lab 04/24/21 0836  WBC 9.1  NEUTROABS 6.3  HGB 14.2  HCT 44.0  MCV 91.5  PLT 241   Cardiac Enzymes: No results for input(s): CKTOTAL, CKMB, CKMBINDEX, TROPONINI in the last 168 hours. BNP: Invalid input(s): POCBNP CBG: Recent Labs  Lab 04/24/21 0744  GLUCAP 174*   D-Dimer No results for input(s): DDIMER in the last 72 hours. Hgb A1c No results for input(s): HGBA1C in the last 72 hours. Lipid Profile No results for input(s): CHOL, HDL, LDLCALC, TRIG, CHOLHDL, LDLDIRECT in the last 72  hours. Thyroid function studies Recent Labs    04/24/21 0836  TSH 2.507   Anemia work up No results for input(s): VITAMINB12, FOLATE, FERRITIN, TIBC, IRON, RETICCTPCT in the last 72 hours. Urinalysis    Component Value Date/Time   COLORURINE STRAW (A) 04/24/2021 1238   APPEARANCEUR CLEAR 04/24/2021 1238   LABSPEC 1.017 04/24/2021 1238   PHURINE 6.0 04/24/2021 1238   GLUCOSEU NEGATIVE 04/24/2021 1238   HGBUR NEGATIVE 04/24/2021 1238   BILIRUBINUR NEGATIVE 04/24/2021 1238   KETONESUR NEGATIVE 04/24/2021 1238   PROTEINUR NEGATIVE 04/24/2021 1238   NITRITE NEGATIVE 04/24/2021 1238   LEUKOCYTESUR  NEGATIVE 04/24/2021 1238   Sepsis Labs Invalid input(s): PROCALCITONIN,  WBC,  LACTICIDVEN Microbiology Recent Results (from the past 240 hour(s))  Resp Panel by RT-PCR (Flu A&B, Covid) Nasopharyngeal Swab     Status: None   Collection Time: 04/24/21  8:16 AM   Specimen: Nasopharyngeal Swab; Nasopharyngeal(NP) swabs in vial transport medium  Result Value Ref Range Status   SARS Coronavirus 2 by RT PCR NEGATIVE NEGATIVE Final    Comment: (NOTE) SARS-CoV-2 target nucleic acids are NOT DETECTED.  The SARS-CoV-2 RNA is generally detectable in upper respiratory specimens during the acute phase of infection. The lowest concentration of SARS-CoV-2 viral copies this assay can detect is 138 copies/mL. A negative result does not preclude SARS-Cov-2 infection and should not be used as the sole basis for treatment or other patient management decisions. A negative result may occur with  improper specimen collection/handling, submission of specimen other than nasopharyngeal swab, presence of viral mutation(s) within the areas targeted by this assay, and inadequate number of viral copies(<138 copies/mL). A negative result must be combined with clinical observations, patient history, and epidemiological information. The expected result is Negative.  Fact Sheet for Patients:  EntrepreneurPulse.com.au  Fact Sheet for Healthcare Providers:  IncredibleEmployment.be  This test is no t yet approved or cleared by the [place] FDA and  has been authorized for detection and/or diagnosis of SARS-CoV-2 by FDA under an Emergency Use Authorization (EUA). This EUA will remain  in effect (meaning this test can be used) for the duration of the COVID-19 declaration under Section 564(b)(1) of the Act, 21 U.S.C.section 360bbb-3(b)(1), unless the authorization is terminated  or revoked sooner.       Influenza A by PCR NEGATIVE NEGATIVE Final   Influenza B by PCR  NEGATIVE NEGATIVE Final    Comment: (NOTE) The Xpert Xpress SARS-CoV-2/FLU/RSV plus assay is intended as an aid in the diagnosis of influenza from Nasopharyngeal swab specimens and should not be used as a sole basis for treatment. Nasal washings and aspirates are unacceptable for Xpert Xpress SARS-CoV-2/FLU/RSV testing.  Fact Sheet for Patients: EntrepreneurPulse.com.au  Fact Sheet for Healthcare Providers: IncredibleEmployment.be  This test is not yet approved or cleared by the [place] FDA and has been authorized for detection and/or diagnosis of SARS-CoV-2 by FDA under an Emergency Use Authorization (EUA). This EUA will remain in effect (meaning this test can be used) for the duration of the COVID-19 declaration under Section 564(b)(1) of the Act, 21 U.S.C. section 360bbb-3(b)(1), unless the authorization is terminated or revoked.  Performed at [facility], [place] [address] [place], [place] [address]    Time coordinating discharge: Over 30 minutes  SIGNED:  Reubin Milan, MD  [facility] 04/24/2021, 4:38 PM Pager   If 7PM-7AM, please contact night-coverage [email or web address] Password [email or web address]

## 2021-04-24 NOTE — Progress Notes (Signed)
7 day non live Zio per Dr. Wyline Mood

## 2021-04-24 NOTE — H&P (Signed)
History and Physical    Kimberly Hines IEP:[id number] DOB: [birth year] DOA: 04/24/2021  PCP: Lenox Ponds, MD   Patient coming from: Home.  I have personally briefly reviewed patient's old medical records in [facility]  Chief Complaint: Fall.  HPI: Kimberly Hines is a 80 y.o. female with medical history significant of anxiety, hyperlipidemia, hypertension, mild cognitive impairment who was seen today at this facility surgical waiting room with her husband while her son was having hip surgery.  She stood up to go to the bathroom, felt lightheaded and had to sit back down.  She subsequently was able to go to the bathroom and when she was coming out fell on the floor hitting her head.  Her husband did not see her fall but came to assist her.  She had nausea and vomiting afterwards.  She was mildly confused while she was in triage but this has since then resolved.  She does not remember how she fell.  No chest pain, palpitations, diaphoresis, PND, orthopnea or recent pitting edema of the lower extremities.  No fever, chills, sore throat, rhinorrhea, dyspnea, wheezing or hemoptysis.  No abdominal pain, diarrhea, constipation, melena or hematochezia.  No dysuria, frequency or hematuria.  No polyuria, polydipsia, polyphagia or blurred vision.  ED Course: Initial vital signs were temperature 97.5 F, pulse 98, respiration 18, BP 140/86 mmHg O2 sat 100% on room air.  The patient electrolytes were optimized.  She converted to NSR after IV diltiazem.  Lab work: Her urinalysis was unremarkable.  CBC was normal.  Troponin was 3 ng/L.  Magnesium was 2.2 mg/dL.  CMP showed a potassium of 2.6 and CO2 of 19 mmol/L.  Glucose 166 and calcium 8.5 mg/dL.  The rest of the CMP values were unremarkable.  TSH was 2.507 IU/mL and free T4 1.27 ng/L.  Review of Systems: As per HPI otherwise all other systems reviewed and are negative.  Past Medical History:  Diagnosis Date   Anxiety    Hyperlipidemia     Hypertension    Mild cognitive impairment    Past Surgical History:  Procedure Laterality Date   ABDOMINAL HYSTERECTOMY     CHOLECYSTECTOMY     Social History  reports that she has never smoked. She has never used smokeless tobacco. She reports that she does not drink alcohol and does not use drugs.  Allergies  Allergen Reactions   Benicar [Olmesartan] Anaphylaxis and Nausea And Vomiting   Diazepam Anaphylaxis   Latex Rash   Tape Rash    ACE BANDAGES   Family History  Problem Relation Age of Onset   Alzheimer's disease Mother    Cancer Father        [employer] worker   Coronary artery disease Paternal Aunt    Prior to Admission medications   Medication Sig Start Date End Date Taking? Authorizing Provider  amLODipine (NORVASC) 10 MG tablet Take 10 mg by mouth daily.   Yes [provider]  atorvastatin (LIPITOR) 10 MG tablet Take 10 mg by mouth daily. 04/11/21  Yes [provider]  busPIRone (BUSPAR) 5 MG tablet Take 5 mg by mouth 2 (two) times daily. 04/07/21  Yes [provider]  citalopram (CELEXA) 20 MG tablet Take 20 mg by mouth daily. 07/09/20  Yes [provider]  meclizine (ANTIVERT) 25 MG tablet Take 1 tablet by mouth 3 (three) times daily as needed for dizziness or nausea. 10/07/16  Yes [provider]  metFORMIN (GLUCOPHAGE-XR) 500 MG 24 hr  tablet Take 500 mg by mouth daily with supper. 08/07/19  Yes [provider]  rivastigmine (EXELON) 3 MG capsule Take 3 mg by mouth 2 (two) times daily. 04/23/21  Yes [provider]  vitamin B-12 (CYANOCOBALAMIN) 250 MCG tablet Take 250 mcg by mouth daily. 12/26/18  Yes [provider]   Physical Exam: Vitals:   04/24/21 1245 04/24/21 1300 04/24/21 1315 04/24/21 1400  BP: 118/68 126/72 116/72 123/69  Pulse: (!) 57 (!) 59 (!) 57 (!) 57  Resp: 18 20 19 18   Temp:      TempSrc:      SpO2: 95% 97% 91% 97%   Constitutional: NAD, calm, comfortable Eyes: PERRL, lids and  conjunctivae normal ENMT: Mucous membranes are moist. Posterior pharynx clear of any exudate or lesions. Neck: normal, supple, no masses, no thyromegaly Respiratory: clear to auscultation bilaterally, no wheezing, no crackles. Normal respiratory effort. No accessory muscle use.  Cardiovascular: Regular rate and rhythm, no murmurs / rubs / gallops. No extremity edema. 2+ pedal pulses. No carotid bruits.  Abdomen: No distention.  No tenderness, no masses palpated. No hepatosplenomegaly. Bowel sounds positive.  Musculoskeletal: no clubbing / cyanosis. No joint deformity upper and lower extremities. Good ROM, no contractures. Normal muscle tone.  Skin: no rashes, lesions, ulcers. No induration Neurologic: CN 2-12 grossly intact. Sensation intact, DTR normal. Strength 5/5 in all 4.  Psychiatric: Normal judgment and insight. Alert and oriented x 3. Normal mood.   Labs on Admission: I have personally reviewed following labs and imaging studies  CBC: Recent Labs  Lab 04/24/21 0836  WBC 9.1  NEUTROABS 6.3  HGB 14.2  HCT 44.0  MCV 91.5  PLT [id number]    Basic Metabolic Panel: Recent Labs  Lab 04/24/21 0836  NA 136  K 2.6*  CL 107  CO2 19*  GLUCOSE 166*  BUN 16  CREATININE 0.85  CALCIUM 8.5*  MG 2.2    GFR: CrCl cannot be calculated (Unknown ideal weight.).  Liver Function Tests: Recent Labs  Lab 04/24/21 0836  AST 14*  ALT 13  ALKPHOS 75  BILITOT 0.5  PROT 6.7  ALBUMIN 3.7    Urine analysis:    Component Value Date/Time   COLORURINE STRAW (A) 04/24/2021 1238   APPEARANCEUR CLEAR 04/24/2021 1238   LABSPEC 1.017 04/24/2021 1238   PHURINE 6.0 04/24/2021 1238   GLUCOSEU NEGATIVE 04/24/2021 1238   HGBUR NEGATIVE 04/24/2021 1238   [place] 04/24/2021 1238   [place] 04/24/2021 1238   PROTEINUR NEGATIVE 04/24/2021 1238   NITRITE NEGATIVE 04/24/2021 1238   LEUKOCYTESUR NEGATIVE 04/24/2021 1238    Radiological Exams on Admission: CT HEAD WO  CONTRAST (5MM)  Result Date: 04/24/2021 CLINICAL DATA:  Fall, laceration above left eyebrow EXAM: CT HEAD WITHOUT CONTRAST CT CERVICAL SPINE WITHOUT CONTRAST TECHNIQUE: Multidetector CT imaging of the head and cervical spine was performed following the standard protocol without intravenous contrast. Multiplanar CT image reconstructions of the cervical spine were also generated. COMPARISON:  Brain MRI 12/15/2020 FINDINGS: CT HEAD FINDINGS Brain: There is no evidence of acute intracranial hemorrhage, extra-axial fluid collection, or acute infarct. There is mild global parenchymal volume loss. The ventricles are stable in size. Patchy hypodensity in the subcortical and periventricular white matter likely reflects sequela of chronic white matter microangiopathy. There is no solid mass lesion. There is no midline shift. Vascular: No hyperdense vessel or unexpected calcification. Skull: Normal. Negative for fracture or focal lesion. Sinuses/Orbits: The imaged paranasal sinuses are clear. Bilateral lens  implants are in place. The globes and orbits are otherwise unremarkable. Other: None. CT CERVICAL SPINE FINDINGS Alignment: Normal. Skull base and vertebrae: Skull base alignment is maintained. Vertebral body heights are preserved. There is no evidence of acute fracture. Soft tissues and spinal canal: No prevertebral fluid or swelling. No visible canal hematoma. Disc levels: There is multilevel intervertebral disc space narrowing with associated degenerative endplate change, most advanced at C5-C6 and C6-C7. There is relatively mild multilevel facet arthropathy. There is severe bilateral neural foraminal stenosis at C3-C4, on the right at C4-C5, and on the left at C5-C6 and C6-C7. Upper chest: There is mild scarring in the lung apices. Other: None. IMPRESSION: 1. No acute intracranial hemorrhage or calvarial fracture. 2. No acute fracture or traumatic malalignment of the cervical spine. 3. Multilevel degenerative changes  throughout the cervical spine as above. Electronically Signed   By: Valetta Mole M.D.   On: 04/24/2021 09:30   CT Angio Chest PE W and/or Wo Contrast  Result Date: 04/24/2021 CLINICAL DATA:  Pulmonary embolism, rule out EXAM: CT ANGIOGRAPHY CHEST WITH CONTRAST TECHNIQUE: Multidetector CT imaging of the chest was performed using the standard protocol during bolus administration of intravenous contrast. Multiplanar CT image reconstructions and MIPs were obtained to evaluate the vascular anatomy. CONTRAST:  40mL OMNIPAQUE IOHEXOL 350 MG/ML SOLN COMPARISON:  None. FINDINGS: Cardiovascular: Satisfactory opacification of the pulmonary arteries to the segmental level. No evidence of pulmonary embolism. Normal heart size. No pericardial effusion. Mitral annular calcifications. Mild coronary artery calcifications. Mediastinum/Nodes: Prominent right hilar lymph node, likely reactive. No other mediastinal adenopathy. No axillary lymphadenopathy. Thyroid is unremarkable. The esophagus is unremarkable. Lungs/Pleura: And the central airways are patent. There is mild diffuse bronchial wall thickening most prominent lung bases. Bibasilar atelectatic change, hypoventilatory change, and mild interlobular septal thickening with ground-glass opacities most prominent the lung bases. No focal airspace consolidation. No suspicious pulmonary nodules or masses. Trace bilateral pleural effusions. No pneumothorax. Upper Abdomen: No acute abnormality. Musculoskeletal: No acute osseous abnormality. No suspicious lytic or blastic lesions. Multilevel degenerative changes of the spine. Review of the MIP images confirms the above findings. IMPRESSION: No evidence of pulmonary embolism. Mild pulmonary edema in the lung bases with trace bilateral pleural effusions and bibasilar atelectasis. Electronically Signed   By: Maurine Simmering M.D.   On: 04/24/2021 11:20   CT Cervical Spine Wo Contrast  Result Date: 04/24/2021 CLINICAL DATA:  Fall,  laceration above left eyebrow EXAM: CT HEAD WITHOUT CONTRAST CT CERVICAL SPINE WITHOUT CONTRAST TECHNIQUE: Multidetector CT imaging of the head and cervical spine was performed following the standard protocol without intravenous contrast. Multiplanar CT image reconstructions of the cervical spine were also generated. COMPARISON:  Brain MRI 12/15/2020 FINDINGS: CT HEAD FINDINGS Brain: There is no evidence of acute intracranial hemorrhage, extra-axial fluid collection, or acute infarct. There is mild global parenchymal volume loss. The ventricles are stable in size. Patchy hypodensity in the subcortical and periventricular white matter likely reflects sequela of chronic white matter microangiopathy. There is no solid mass lesion. There is no midline shift. Vascular: No hyperdense vessel or unexpected calcification. Skull: Normal. Negative for fracture or focal lesion. Sinuses/Orbits: The imaged paranasal sinuses are clear. Bilateral lens implants are in place. The globes and orbits are otherwise unremarkable. Other: None. CT CERVICAL SPINE FINDINGS Alignment: Normal. Skull base and vertebrae: Skull base alignment is maintained. Vertebral body heights are preserved. There is no evidence of acute fracture. Soft tissues and spinal canal: No prevertebral fluid or swelling. No  visible canal hematoma. Disc levels: There is multilevel intervertebral disc space narrowing with associated degenerative endplate change, most advanced at C5-C6 and C6-C7. There is relatively mild multilevel facet arthropathy. There is severe bilateral neural foraminal stenosis at C3-C4, on the right at C4-C5, and on the left at C5-C6 and C6-C7. Upper chest: There is mild scarring in the lung apices. Other: None. IMPRESSION: 1. No acute intracranial hemorrhage or calvarial fracture. 2. No acute fracture or traumatic malalignment of the cervical spine. 3. 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 Electronically Signed    By: Valetta Mole M.D.   On: 04/24/2021 09:30   DG Chest Portable 1 View  Result Date: 04/24/2021 CLINICAL DATA:  Syncopal event, fell striking head on floor EXAM: PORTABLE CHEST 1 VIEW COMPARISON:  Portable exam 0826 hours compared to 10/29/2020 FINDINGS: Normal heart size, mediastinal contours, and pulmonary vascularity. Minimal subsegmental atelectasis at lung bases. Lungs otherwise clear. No infiltrate, pleural effusion or pneumothorax. Osseous structures demineralized. IMPRESSION: Minimal bibasilar atelectasis. Electronically Signed   By: Lavonia Dana M.D.   On: 04/24/2021 08:40    EKG: Independently reviewed.  Vent. rate 113 BPM PR interval * ms QRS duration 91 ms QT/QTcB 386/530 ms P-R-T axes * 53 36 Atrial fibrillation ST depr, consider ischemia, inferior leads Prolonged QT interval  Assessment/Plan Principal Problem:   Syncope and collapse Observation/PCU. Continue IV fluids. Check carotid Doppler. Check echocardiogram. Cardiology consult appreciated.  Active Problems:   Atrial fibrillation with RVR. Resolved. Converted to sinus. Cardiology will follow as an outpatient Will discharge the patient home on Eliquis.    Prolonged Q-T interval on ECG Magnesium and potassium were supplemented.    Type 2 diabetes mellitus without complication,  without long-term current use of insulin (HCC) Carbohydrate modified diet. Continue metformin 500 mg p.o. daily. CBG monitoring before meals and bedtime.     Hyperlipidemia Continue atorvastatin 10 mg p.o. daily.    Hypertension Continue amlodipine 10 mg p.o. daily.    Hypokalemia Replaced. Follow-up potassium level.    Hypocalcemia Follow-up calcium level. Further work-up depending on results.    DVT prophylaxis: Lovenox SQ.  Cardiology to discuss DOAC Code Status:   Full code. Family Communication:   Disposition Plan:   Patient is from:  Home.  Anticipated DC to:  Home.  Anticipated DC  date:  04/25/2021.  Anticipated DC barriers: Clinical status/pending echo.  Consults called:  Cardiology  Admission status:  Observation/PCU.  Severity of Illness:  High severity after presenting with LOC in the setting of a syncopal episode associated with atrial fibrillation with RVR and prolonged QT interval on EKG.  The patient responded to IV fluids, diltiazem 10 mg bolus and electrolyte replacement.  Reubin Milan MD [facility]  How to contact the [facility] Attending or Consulting provider [place] or covering provider during after hours [place], for this patient?   Check the care team in [facility] and look for a) attending/consulting [facility] provider listed and b) the [facility] team listed Log into [email or web address] and use [place]'s universal password to access. If you do not have the password, please contact the hospital operator. Locate the [facility] provider you are looking for under [facility] and page to a number that you can be directly reached. If you still have difficulty reaching the provider, please page the [facility] (Director on Call) for the Hospitalists listed on amion for assistance.  04/24/2021, 2:17 PM   This document was prepared using Dragon voice recognition  software and may contain some unintended transcription errors.

## 2021-04-24 NOTE — Progress Notes (Addendum)
ANTICOAGULATION CONSULT NOTE - Initial Consult  Pharmacy Consult for heparin  Indication: atrial fibrillation  Allergies  Allergen Reactions   Benicar [Olmesartan] Anaphylaxis and Nausea And Vomiting   Diazepam Anaphylaxis   Latex Rash   Tape Rash    ACE BANDAGES    Patient Measurements:   Heparin Dosing Weight:   Vital Signs: Temp: 97.5 F (36.4 C) (12/02 0740) Temp Source: Oral (12/02 0740) BP: 119/69 (12/02 1515) Pulse Rate: 57 (12/02 1515)  Labs: Recent Labs    04/24/21 0836  HGB 14.2  HCT 44.0  PLT 241  CREATININE 0.85  TROPONINIHS 3    CrCl cannot be calculated (Unknown ideal weight.).   Medical History: Past Medical History:  Diagnosis Date   Anxiety    Hyperlipidemia    Hypertension    Mild cognitive impairment    Type 2 diabetes mellitus without complication, without long-term current use of insulin (HCC) 05/26/2017    Assessment: 80 yo F to start heparin per pharmacy for newly recognized transient Afib.  No anticoagulants PTA.  CBC WNL, SCr WNL. No height/weight in Epic> have placed orders & messaged RN & NT.  Goal of Therapy:  Heparin level 0.3-0.7 units/ml Monitor platelets by anticoagulation protocol: Yes   Plan:  Waiting for height/weight to dose heparin  Herby Abraham, Pharm.D 04/24/2021 4:18 PM  Addendum Changed to apixaban by cardiology  Herby Abraham, Pharm.D 04/24/2021 4:24 PM

## 2021-04-24 NOTE — Telephone Encounter (Signed)
Monitor was enrolled and will be mailed to patients home

## 2021-04-24 NOTE — Progress Notes (Unsigned)
Enrolled patient for a 7 day Zio XT monitor to be mailed to patients home.  

## 2021-04-24 NOTE — Discharge Instructions (Signed)

## 2021-08-04 LAB — HM DEXA SCAN

## 2021-08-04 LAB — HM MAMMOGRAPHY

## 2021-12-04 DIAGNOSIS — I480 Paroxysmal atrial fibrillation: Secondary | ICD-10-CM | POA: Insufficient documentation

## 2022-01-01 LAB — HEMOGLOBIN A1C: Hemoglobin A1C: 6.6

## 2022-01-26 ENCOUNTER — Encounter: Payer: Self-pay | Admitting: Family Medicine

## 2022-01-26 ENCOUNTER — Ambulatory Visit (INDEPENDENT_AMBULATORY_CARE_PROVIDER_SITE_OTHER): Payer: Medicare Other | Admitting: Family Medicine

## 2022-01-26 VITALS — BP 128/67 | HR 67 | Ht 62.0 in | Wt 161.0 lb

## 2022-01-26 DIAGNOSIS — I10 Essential (primary) hypertension: Secondary | ICD-10-CM | POA: Diagnosis not present

## 2022-01-26 DIAGNOSIS — R011 Cardiac murmur, unspecified: Secondary | ICD-10-CM

## 2022-01-26 DIAGNOSIS — F419 Anxiety disorder, unspecified: Secondary | ICD-10-CM

## 2022-01-26 DIAGNOSIS — E538 Deficiency of other specified B group vitamins: Secondary | ICD-10-CM

## 2022-01-26 DIAGNOSIS — E119 Type 2 diabetes mellitus without complications: Secondary | ICD-10-CM

## 2022-01-26 DIAGNOSIS — K529 Noninfective gastroenteritis and colitis, unspecified: Secondary | ICD-10-CM | POA: Insufficient documentation

## 2022-01-26 DIAGNOSIS — R6881 Early satiety: Secondary | ICD-10-CM | POA: Diagnosis not present

## 2022-01-26 DIAGNOSIS — R112 Nausea with vomiting, unspecified: Secondary | ICD-10-CM

## 2022-01-26 DIAGNOSIS — R9431 Abnormal electrocardiogram [ECG] [EKG]: Secondary | ICD-10-CM

## 2022-01-26 DIAGNOSIS — R55 Syncope and collapse: Secondary | ICD-10-CM

## 2022-01-26 DIAGNOSIS — R42 Dizziness and giddiness: Secondary | ICD-10-CM | POA: Diagnosis not present

## 2022-01-26 MED ORDER — ESCITALOPRAM OXALATE 5 MG PO TABS: 5.0000 mg | ORAL_TABLET | Freq: Every day | ORAL | 0 refills | Status: DC

## 2022-01-26 NOTE — Assessment & Plan Note (Addendum)
This is chronic and has been present for years. Not currently seeing a GI doc

## 2022-01-26 NOTE — Progress Notes (Unsigned)
New Patient Office Visit  Subjective    Patient ID: Kimberly Hines, female    DOB: 27-Jan-1941  Age: 81 y.o. MRN: [id number]  CC:  Chief Complaint  Patient presents with   Establish Care    HPI [name] presents to establish care.  Reports having episodes on and off for the last year of feeling "swimmy headed".  She will feel constantly cold and then her hands and arms will feel like they are tingling.  The whole episode usually lasts about an hour.  She has had a couple of heart monitor and she has been to the ED a couple of times.  Her grandson who is a paramedic has even come and checked on her blood pressures seem to always be good pulses sounds to be always been normal.  Her daughter also reports some early satiety for the last several months.  She states she is starving and then she eats 2 bites and then does not want to eat anymore.  She denies any abdominal pain.  History of chronic diarrhea that is been going on for years.  DM-she has been off the metformin for about a month.  She stopped it to see if it was causing some of the GI symptoms but has not noticed a big difference being off of it.   Outpatient Encounter Medications as of 01/26/2022  Medication Sig   amLODipine (NORVASC) 10 MG tablet Take 10 mg by mouth daily.   atorvastatin (LIPITOR) 10 MG tablet Take 10 mg by mouth daily.   busPIRone (BUSPAR) 5 MG tablet Take 5 mg by mouth 2 (two) times daily.   calcium carbonate (OS-CAL - DOSED IN MG OF ELEMENTAL CALCIUM) 1250 (500 Ca) MG tablet Take 600 mg by mouth.   escitalopram (LEXAPRO) 5 MG tablet Take 1 tablet (5 mg total) by mouth daily.   rivastigmine (EXELON) 3 MG capsule Take 3 mg by mouth 2 (two) times daily.   vitamin B-12 (CYANOCOBALAMIN) 250 MCG tablet Take 250 mcg by mouth daily.   potassium bicarbonate (K-LYTE) 25 MEQ disintegrating tablet Take 25 mEq by mouth 2 (two) times daily.   [DISCONTINUED] apixaban (ELIQUIS) 5 MG TABS tablet Take 1 tablet (5 mg  total) by mouth 2 (two) times daily. (Patient not taking: Reported on 01/26/2022)   [DISCONTINUED] citalopram (CELEXA) 20 MG tablet Take 20 mg by mouth daily. (Patient not taking: Reported on 01/26/2022)   [DISCONTINUED] escitalopram (LEXAPRO) 20 MG tablet Take 20 mg by mouth daily.   [DISCONTINUED] meclizine (ANTIVERT) 25 MG tablet Take 1 tablet by mouth 3 (three) times daily as needed for dizziness or nausea. (Patient not taking: Reported on 01/26/2022)   [DISCONTINUED] metFORMIN (GLUCOPHAGE-XR) 500 MG 24 hr tablet Take 500 mg by mouth daily with supper. (Patient not taking: Reported on 01/26/2022)   No facility-administered encounter medications on file as of 01/26/2022.    Past Medical History:  Diagnosis Date   Anxiety    Hyperlipidemia    Hypertension    Mild cognitive impairment    Type 2 diabetes mellitus without complication, without long-term current use of insulin (HCC) 05/26/2017    Past Surgical History:  Procedure Laterality Date   ABDOMINAL HYSTERECTOMY     BACK SURGERY     3 times   CHOLECYSTECTOMY      Family History  Problem Relation Age of Onset   Alzheimer's disease Mother    Cancer Father        Education officer, environmental  Coronary artery disease Paternal Aunt    Diabetes Other     Social History   Socioeconomic History   Marital status: Married    Spouse name: Not on file   Number of children: Not on file   Years of education: Not on file   Highest education level: Not on file  Occupational History   Not on file  Tobacco Use   Smoking status: Never   Smokeless tobacco: Never  Substance and Sexual Activity   Alcohol use: Never   Drug use: Never   Sexual activity: Yes    Partners: Male    Birth control/protection: Other-see comments    Comment: hysterectomy  Other Topics Concern   Not on file  Social History Narrative   Not on file   Social Determinants of Health   Financial Resource Strain: Not on file  Food Insecurity: Not on file  Transportation Needs: Not  on file  Physical Activity: Not on file  Stress: Not on file  Social Connections: Not on file  Intimate Partner Violence: Not on file    Review of Systems  Constitutional:  Positive for malaise/fatigue.  Respiratory:  Positive for shortness of breath.   Gastrointestinal:  Positive for diarrhea, heartburn, nausea and vomiting.  Psychiatric/Behavioral:  Positive for depression. The patient is nervous/anxious.   All other systems reviewed and are negative.       Objective    BP 128/67   Pulse 67   Ht 5\' 2"  (1.575 m)   Wt 161 lb (73 kg)   SpO2 97%   BMI 29.45 kg/m   Physical Exam Vitals and nursing note reviewed.  Constitutional:      Appearance: She is well-developed.  HENT:     Head: Normocephalic and atraumatic.  Cardiovascular:     Rate and Rhythm: Normal rate and regular rhythm.     Heart sounds: Normal heart sounds.  Pulmonary:     Effort: Pulmonary effort is normal.     Breath sounds: Normal breath sounds.  Abdominal:     General: Abdomen is flat. Bowel sounds are normal. There is no distension.     Palpations: Abdomen is soft.     Tenderness: There is no abdominal tenderness.  Skin:    General: Skin is warm and dry.  Neurological:     Mental Status: She is alert and oriented to person, place, and time.  Psychiatric:        Behavior: Behavior normal.     {Labs (Optional):23779}    Assessment & Plan:   Problem List Items Addressed This Visit       Cardiovascular and Mediastinum   Hypertension - Primary    Well controlled. Continue current regimen. Follow up in  6 mo         Digestive   Chronic diarrhea    This is chronic and has been present for years. Not currently seeing a GI doc        Endocrine   Type 2 diabetes mellitus without complication, without long-term current use of insulin (HCC)    Last A1C was 6.6 about 3 weeks ago. Off metformin for one month. Plan to recheck A1C in 9 weeks.         Other   Prolonged Q-T interval on ECG     Will taper lexapro and try switching to prozac.        Heart murmur   Anxiety    She and her daughter feel the lexapro is really  not helping and she does have prolonged QT.  Will taper the medication and switch to Prozac, luvox,  or sertraline. Will choose prozac.        Relevant Medications   escitalopram (LEXAPRO) 5 MG tablet   Other Visit Diagnoses     Lightheadedness       Early satiety       Relevant Orders   Ambulatory referral to Gastroenterology   Nausea and vomiting, unspecified vomiting type          Early satiety - will refer to GI for further workup.  Consider gastroparesis. Weight stable over the last 6 months.     Return in about 4 weeks (around 02/23/2022) for New start medication.   Nani Gasser, MD

## 2022-01-26 NOTE — Patient Instructions (Addendum)
Go ahead and cut the Lexapro in half and take a half a tab for the next 8 days.  I sent over a prescription for 5 mg so once you complete the half tab a day for 8 days then take the 5 mg dose for 8 days and then stop.  Then we are going to  switch to something else.

## 2022-01-26 NOTE — Assessment & Plan Note (Signed)
Well controlled. Continue current regimen. Follow up in  6 mo  

## 2022-01-26 NOTE — Assessment & Plan Note (Signed)
Last A1C was 6.6 about 3 weeks ago. Off metformin for one month. Plan to recheck A1C in 9 weeks.

## 2022-01-26 NOTE — Assessment & Plan Note (Signed)
Will taper lexapro and try switching to prozac.

## 2022-01-26 NOTE — Assessment & Plan Note (Addendum)
She and her daughter feel the lexapro is really not helping and she does have prolonged QT.  Will taper the medication and switch to Prozac, luvox,  or sertraline. Will choose prozac.

## 2022-01-27 NOTE — Assessment & Plan Note (Signed)
.    It sounds like she has had somewhat of a cardiac work-up with 2 different heart monitors.  Orthostatics here as well and she did drop about 15 points.  It almost sounds like she is becoming hypotensive as her extremities will get cold and then she will feel like she is going to pass out.  She has also had some chest discomfort with it.

## 2022-01-27 NOTE — Assessment & Plan Note (Signed)
On a supplement.

## 2022-02-02 ENCOUNTER — Other Ambulatory Visit: Payer: Self-pay | Admitting: Family Medicine

## 2022-02-06 ENCOUNTER — Encounter: Payer: Self-pay | Admitting: Family Medicine

## 2022-02-19 ENCOUNTER — Encounter: Payer: Self-pay | Admitting: Family Medicine

## 2022-02-24 ENCOUNTER — Ambulatory Visit: Payer: PRIVATE HEALTH INSURANCE | Admitting: Family Medicine

## 2022-03-09 ENCOUNTER — Encounter: Payer: Self-pay | Admitting: Family Medicine

## 2022-03-09 ENCOUNTER — Ambulatory Visit (INDEPENDENT_AMBULATORY_CARE_PROVIDER_SITE_OTHER): Payer: Medicare Other | Admitting: Family Medicine

## 2022-03-09 VITALS — BP 134/75 | HR 70 | Ht 62.0 in | Wt 160.0 lb

## 2022-03-09 DIAGNOSIS — R251 Tremor, unspecified: Secondary | ICD-10-CM | POA: Diagnosis not present

## 2022-03-09 DIAGNOSIS — E559 Vitamin D deficiency, unspecified: Secondary | ICD-10-CM | POA: Diagnosis not present

## 2022-03-09 DIAGNOSIS — I10 Essential (primary) hypertension: Secondary | ICD-10-CM

## 2022-03-09 DIAGNOSIS — R200 Anesthesia of skin: Secondary | ICD-10-CM

## 2022-03-09 DIAGNOSIS — E119 Type 2 diabetes mellitus without complications: Secondary | ICD-10-CM

## 2022-03-09 DIAGNOSIS — R5383 Other fatigue: Secondary | ICD-10-CM | POA: Diagnosis not present

## 2022-03-09 DIAGNOSIS — R202 Paresthesia of skin: Secondary | ICD-10-CM

## 2022-03-09 NOTE — Assessment & Plan Note (Signed)
Last A1c looks great at 6.6.

## 2022-03-09 NOTE — Progress Notes (Signed)
Acute Office Visit  Subjective:     Patient ID: Kimberly Hines, female    DOB: 1941/01/19, 81 y.o.   MRN: [id number]  Chief Complaint  Patient presents with   Fatigue    HPI Patient is in today for times that started over the weekend including feeling shaky fatigued and having some numbness and tingling.  Her husband who is here with her today reports that they called EMS and checked her out.  Everything seemed to be normal EKG was normal.  She did not end up going to the emergency department.  This is actually happened a couple of times.  The first time was it was a year ago and this last time was on Sunday approximately 2 days ago.  Her husband reports that each episode lasts probably about a couple of hours and then seems to just get better on its own.  He thinks that most of the time when it has happened she has not eaten or skipped breakfast.  They have not checked her blood sugar when it happens at least that he is aware of.  Feels extremely weak when it happens.  None of her medications are new.  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  It looks like they have ordered an MRI for her.  She is currently on Exelon 3 mg capsule twice a day.  Her mood she is also on Lexapro 5 mg a day and buspirone 5 mg twice a day.  These are not new.  Usually takes a potassium supplement but ran out about a month ago.  Wants to know if potassium is okay.  ROS      Objective:    BP 134/75   Pulse 70   Ht 5\' 2"  (1.575 m)   Wt 160 lb (72.6 kg)   SpO2 99%   BMI 29.26 kg/m    Physical Exam Vitals and nursing note reviewed.  Constitutional:      Appearance: She is well-developed.  HENT:     Head: Normocephalic and atraumatic.  Eyes:     Conjunctiva/sclera: Conjunctivae normal.  Neck:     Vascular: No carotid bruit.  Cardiovascular:     Rate and Rhythm: Normal rate and regular rhythm.     Heart sounds: Normal heart sounds.  Pulmonary:     Effort: Pulmonary  effort is normal.     Breath sounds: Normal breath sounds.  Musculoskeletal:     Cervical back: Neck supple.  Skin:    General: Skin is warm and dry.  Neurological:     Mental Status: She is alert and oriented to person, place, and time.  Psychiatric:        Behavior: Behavior normal.     No results found for any visits on 03/09/22.      Assessment & Plan:   Problem List Items Addressed This Visit       Cardiovascular and Mediastinum   Hypertension    Blood pressure is at goal and orthostatics are normal today which is great.  She had borderline orthostatics when I last saw her so it looks much better.        Endocrine   Type 2 diabetes mellitus without complication, without long-term current use of insulin (HCC)    Last A1c looks great at 6.6.      Other Visit Diagnoses     Vitamin D deficiency    -  Primary   Relevant Orders   VITAMIN  D 25 Hydroxy (Vit-D Deficiency, Fractures)   CBC with Differential/Platelet   Urinalysis, Routine w reflex microscopic   COMPLETE METABOLIC PANEL WITH GFR   TSH   Numbness and tingling in left arm       Relevant Orders   VITAMIN D 25 Hydroxy (Vit-D Deficiency, Fractures)   CBC with Differential/Platelet   Urinalysis, Routine w reflex microscopic   COMPLETE METABOLIC PANEL WITH GFR   TSH   Shaky       Relevant Orders   VITAMIN D 25 Hydroxy (Vit-D Deficiency, Fractures)   CBC with Differential/Platelet   Urinalysis, Routine w reflex microscopic   COMPLETE METABOLIC PANEL WITH GFR   TSH   Other fatigue       Relevant Orders   VITAMIN D 25 Hydroxy (Vit-D Deficiency, Fractures)   CBC with Differential/Platelet   Urinalysis, Routine w reflex microscopic   COMPLETE METABOLIC PANEL WITH GFR   TSH      If she has another episode of feeling shaky and with numbness and tingling encouraged her to check her blood sugar to make sure that she is not having hypoglycemic event.  Her husband feels like these episodes are related to  when she does not eat.  We will get some up-to-date blood work today just to rule out other underlying issues.  No orders of the defined types were placed in this encounter.   Return in about 4 months (around 07/10/2022) for Diabetes follow-up.  Nani Gasser, MD

## 2022-03-09 NOTE — Assessment & Plan Note (Signed)
Blood pressure is at goal and orthostatics are normal today which is great.  She had borderline orthostatics when I last saw her so it looks much better.

## 2022-03-10 ENCOUNTER — Other Ambulatory Visit: Payer: Self-pay | Admitting: Family Medicine

## 2022-03-10 LAB — CBC WITH DIFFERENTIAL/PLATELET
Absolute Monocytes: 747 cells/uL (ref 200–950)
Basophils Absolute: 81 cells/uL (ref 0–200)
Basophils Relative: 0.8 %
Eosinophils Absolute: 111 cells/uL (ref 15–500)
Eosinophils Relative: 1.1 %
HCT: 42.3 % (ref 35.0–45.0)
Hemoglobin: 14.2 g/dL (ref 11.7–15.5)
Lymphs Abs: 1545 cells/uL (ref 850–3900)
MCH: 29.8 pg (ref 27.0–33.0)
MCHC: 33.6 g/dL (ref 32.0–36.0)
MCV: 88.7 fL (ref 80.0–100.0)
MPV: 11.5 fL (ref 7.5–12.5)
Monocytes Relative: 7.4 %
Neutro Abs: 7615 cells/uL (ref 1500–7800)
Neutrophils Relative %: 75.4 %
Platelets: 267 10*3/uL (ref 140–400)
RBC: 4.77 10*6/uL (ref 3.80–5.10)
RDW: 13.2 % (ref 11.0–15.0)
Total Lymphocyte: 15.3 %
WBC: 10.1 10*3/uL (ref 3.8–10.8)

## 2022-03-10 LAB — COMPLETE METABOLIC PANEL WITH GFR
AG Ratio: 1.6 (calc) (ref 1.0–2.5)
ALT: 11 U/L (ref 6–29)
AST: 10 U/L (ref 10–35)
Albumin: 4.3 g/dL (ref 3.6–5.1)
Alkaline phosphatase (APISO): 95 U/L (ref 37–153)
BUN: 13 mg/dL (ref 7–25)
CO2: 28 mmol/L (ref 20–32)
Calcium: 10.6 mg/dL — ABNORMAL HIGH (ref 8.6–10.4)
Chloride: 102 mmol/L (ref 98–110)
Creat: 0.96 mg/dL (ref 0.60–1.00)
Globulin: 2.7 g/dL (calc) (ref 1.9–3.7)
Glucose, Bld: 163 mg/dL — ABNORMAL HIGH (ref 65–99)
Potassium: 3.2 mmol/L — ABNORMAL LOW (ref 3.5–5.3)
Sodium: 141 mmol/L (ref 135–146)
Total Bilirubin: 0.6 mg/dL (ref 0.2–1.2)
Total Protein: 7 g/dL (ref 6.1–8.1)
eGFR: 60 mL/min/{1.73_m2} (ref >=60)

## 2022-03-10 LAB — VITAMIN D 25 HYDROXY (VIT D DEFICIENCY, FRACTURES): Vit D, 25-Hydroxy: 30 ng/mL (ref 30–100)

## 2022-03-10 LAB — TSH: TSH: 1.79 mIU/L (ref 0.40–4.50)

## 2022-03-10 MED ORDER — POTASSIUM BICARBONATE 25 MEQ PO TBEF: 50.0000 meq | EFFERVESCENT_TABLET | Freq: Every day | ORAL | 1 refills | Status: DC

## 2022-03-10 NOTE — Progress Notes (Signed)
Call patient: Make sure to speak to her husband and her either daughter Lattie Haw.  Potassium level is a little bit low so I definitely want her to restart her potassium.  I sent over a new prescription today.  The calcium level is just a little elevated so we will keep an eye on that.  It was low 10 months ago so not as concerned.  Kidney and liver function are stable.  Vitamin D is low.  He is taking a supplement let me know how much.  If she is not currently taking a supplement and recommend 50 mcg daily.  Count is normal no sign of infection or anemia.  Thyroid looks great.

## 2022-03-18 ENCOUNTER — Ambulatory Visit: Payer: PRIVATE HEALTH INSURANCE | Admitting: Family Medicine

## 2022-03-25 ENCOUNTER — Inpatient Hospital Stay: Payer: PRIVATE HEALTH INSURANCE | Admitting: Family Medicine

## 2022-03-26 ENCOUNTER — Encounter: Payer: Self-pay | Admitting: Family Medicine

## 2022-03-26 ENCOUNTER — Ambulatory Visit (INDEPENDENT_AMBULATORY_CARE_PROVIDER_SITE_OTHER): Payer: Medicare Other | Admitting: Family Medicine

## 2022-03-26 VITALS — BP 133/70 | HR 80 | Ht 62.0 in | Wt 161.1 lb

## 2022-03-26 DIAGNOSIS — E876 Hypokalemia: Secondary | ICD-10-CM | POA: Diagnosis not present

## 2022-03-26 DIAGNOSIS — E119 Type 2 diabetes mellitus without complications: Secondary | ICD-10-CM | POA: Diagnosis not present

## 2022-03-26 DIAGNOSIS — G459 Transient cerebral ischemic attack, unspecified: Secondary | ICD-10-CM | POA: Insufficient documentation

## 2022-03-26 DIAGNOSIS — I10 Essential (primary) hypertension: Secondary | ICD-10-CM | POA: Diagnosis not present

## 2022-03-26 DIAGNOSIS — E785 Hyperlipidemia, unspecified: Secondary | ICD-10-CM | POA: Diagnosis not present

## 2022-03-26 MED ORDER — CLOPIDOGREL BISULFATE 75 MG PO TABS: 75.0000 mg | ORAL_TABLET | Freq: Every day | ORAL | 3 refills | Status: DC

## 2022-03-26 MED ORDER — ESCITALOPRAM OXALATE 20 MG PO TABS: 20.0000 mg | ORAL_TABLET | Freq: Every day | ORAL | 3 refills | Status: DC

## 2022-03-26 MED ORDER — RIVASTIGMINE TARTRATE 4.5 MG PO CAPS: 4.5000 mg | ORAL_CAPSULE | Freq: Two times a day (BID) | ORAL | 3 refills | Status: DC

## 2022-03-26 MED ORDER — ATORVASTATIN CALCIUM 80 MG PO TABS: 80.0000 mg | ORAL_TABLET | Freq: Every day | ORAL | 3 refills | Status: DC

## 2022-03-26 MED ORDER — POTASSIUM BICARBONATE 25 MEQ PO TBEF: 50.0000 meq | EFFERVESCENT_TABLET | Freq: Every day | ORAL | 1 refills | Status: AC

## 2022-03-26 NOTE — Assessment & Plan Note (Signed)
Plan to recheck A1C next months.  No concerns.

## 2022-03-26 NOTE — Assessment & Plan Note (Signed)
Go ahead and place referral to neurology for further evaluation.dong well on inc dose of lipitor.

## 2022-03-26 NOTE — Assessment & Plan Note (Signed)
She is not actually taking the potassium supplement it looks like it was sent in but she says she does not have a prescription for it some get a send a new one to her mail order per her preference.  And hopefully she will get in in the next week or 2 and then we can recheck labs in 4 weeks from today.

## 2022-03-26 NOTE — Assessment & Plan Note (Signed)
To recheck lipids and liver enzymes in 1 month after increasing her statin to 80 mg.  She has been tolerating it well thus far.

## 2022-03-26 NOTE — Assessment & Plan Note (Signed)
BP at goal today.

## 2022-03-26 NOTE — Progress Notes (Signed)
Established Patient Office Visit  Subjective   Patient ID: LAKEIYA GAUSMAN, female    DOB: Jan 19, 1941  Age: 81 y.o. MRN: [id number]  Chief Complaint  Patient presents with   Hospitalization Follow-up    HPI  She went to the ED on 10/29 for left sided numbness, HAs, and tremors.  She gets these episodes that last about 2-1/2 hours and then go away.  Her daughter who is here today wonders if it could also be anxiety induced.  They did decide to treat her for TIA and optimize her medication.  He has never seen neurology for these episodes.  They bumped up her lipitor  to 80 mg and added a baby ASA.  She is also on Plavix.  She has felt well since she has been home and has not had any more episodes.  She would really like for me to look through her medications and try to update her medication list.  Several of her medications are under multiple providers names from previous prescribers and from the emergency department etc.    ROS    Objective:     BP 133/70   Pulse 80   Ht 5\' 2"  (1.575 m)   Wt 161 lb 1.3 oz (73.1 kg)   SpO2 97%   BMI 29.46 kg/m    Physical Exam Vitals and nursing note reviewed.  Constitutional:      Appearance: She is well-developed.  HENT:     Head: Normocephalic and atraumatic.  Cardiovascular:     Rate and Rhythm: Normal rate and regular rhythm.     Heart sounds: Normal heart sounds.  Pulmonary:     Effort: Pulmonary effort is normal.     Breath sounds: Normal breath sounds.  Skin:    General: Skin is warm and dry.  Neurological:     Mental Status: She is alert and oriented to person, place, and time.  Psychiatric:        Behavior: Behavior normal.      No results found for any visits on 03/26/22.    The 10-year ASCVD risk score (Arnett DK, et al., 2019) is: 53.3%    Assessment & Plan:   Problem List Items Addressed This Visit       Cardiovascular and Mediastinum   TIA (transient ischemic attack)    Go ahead and place referral  to neurology for further evaluation.dong well on inc dose of lipitor.        Relevant Medications   aspirin EC 81 MG tablet   atorvastatin (LIPITOR) 80 MG tablet   Other Relevant Orders   Ambulatory referral to Neurology   Hypertension    BP at goal today.        Relevant Medications   aspirin EC 81 MG tablet   atorvastatin (LIPITOR) 80 MG tablet   Other Relevant Orders   COMPLETE METABOLIC PANEL WITH GFR   Hemoglobin A1c   Lipid Panel w/reflex Direct LDL     Endocrine   Type 2 diabetes mellitus without complication, without long-term current use of insulin ([place])    Plan to recheck A1C next months.  No concerns.        Relevant Medications   aspirin EC 81 MG tablet   atorvastatin (LIPITOR) 80 MG tablet   Other Relevant Orders   COMPLETE METABOLIC PANEL WITH GFR   Hemoglobin A1c   Lipid Panel w/reflex Direct LDL     Other   Hypokalemia - Primary    She  is not actually taking the potassium supplement it looks like it was sent in but she says she does not have a prescription for it some get a send a new one to her mail order per her preference.  And hopefully she will get in in the next week or 2 and then we can recheck labs in 4 weeks from today.      Relevant Orders   COMPLETE METABOLIC PANEL WITH GFR   Hemoglobin A1c   Lipid Panel w/reflex Direct LDL   Hyperlipidemia    To recheck lipids and liver enzymes in 1 month after increasing her statin to 80 mg.  She has been tolerating it well thus far.      Relevant Medications   aspirin EC 81 MG tablet   atorvastatin (LIPITOR) 80 MG tablet   Other Relevant Orders   COMPLETE METABOLIC PANEL WITH GFR   Hemoglobin A1c   Lipid Panel w/reflex Direct LDL    Return in about 1 month (around 04/25/2022) for Nurse visit for labs.  the schedule f/u with me in 3 months from today .    Beatrice Lecher, MD

## 2022-05-17 IMAGING — CT CT HEAD W/O CM
4 of 8 series · 14 of 42 positions shown, 15 images · non-contrast
Comparison: Brain MRI 12/15/2020

CLINICAL DATA: Fall, laceration above left eyebrow

EXAM:
CT HEAD WITHOUT CONTRAST
CT CERVICAL SPINE WITHOUT CONTRAST
TECHNIQUE: Multidetector CT imaging of the head and cervical spine was
performed following the standard protocol without intravenous
contrast. Multiplanar CT image reconstructions of the cervical spine
were also generated.

[Series 2: head wo · axial · 0.47mm/px · z∈[-145,-5]mm · 3 of 29 slices shown, 4 images]
[im 1/29  brain]
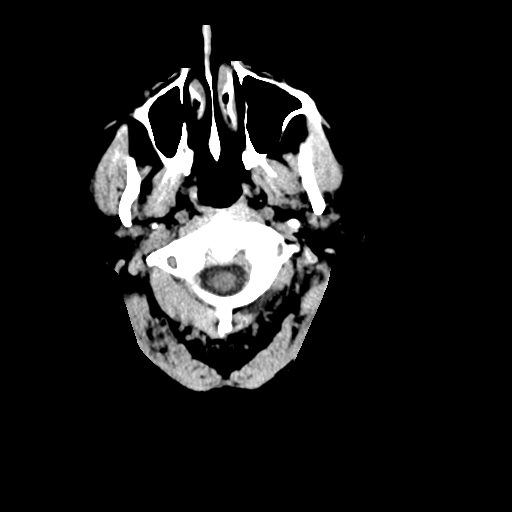
[im 1/29  bone]
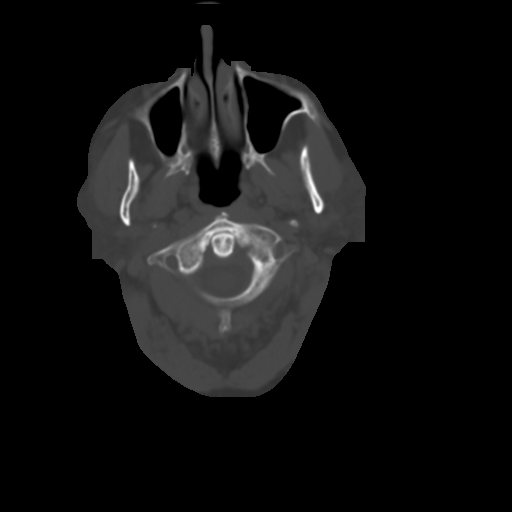
[im 15/29  brain]
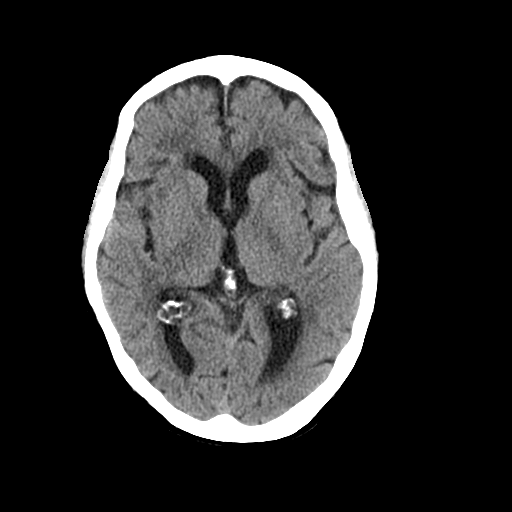
[im 29/29  brain]
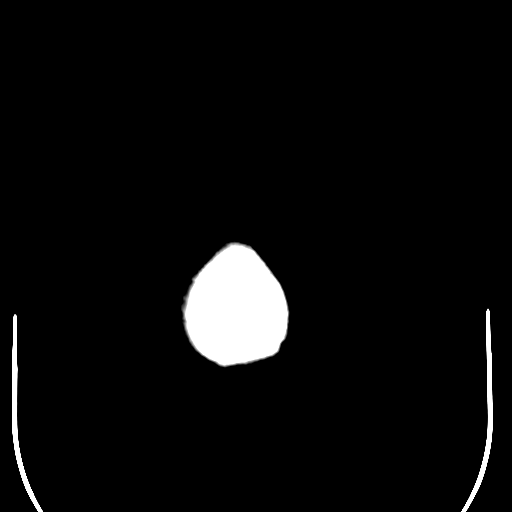

[Series 6: sagittal soft tissue · sagittal · 0.28mm/px · 1 of 53 slices shown]
[im 27/53  brain]
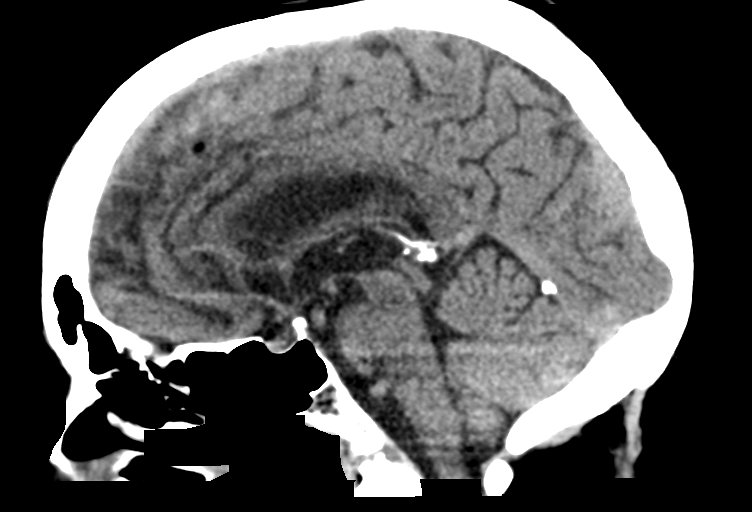

[Series 10: orthogonal bone · axial · 0.23mm/px · z∈[-298,-164]mm · 8 of 91 slices shown]
[im 11/91  bone]
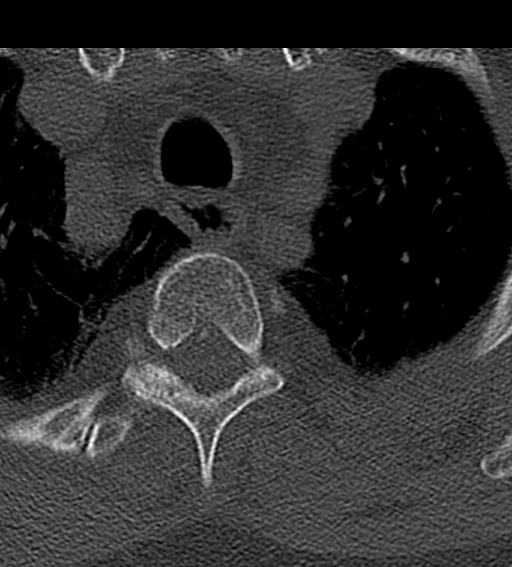
[im 21/91  bone]
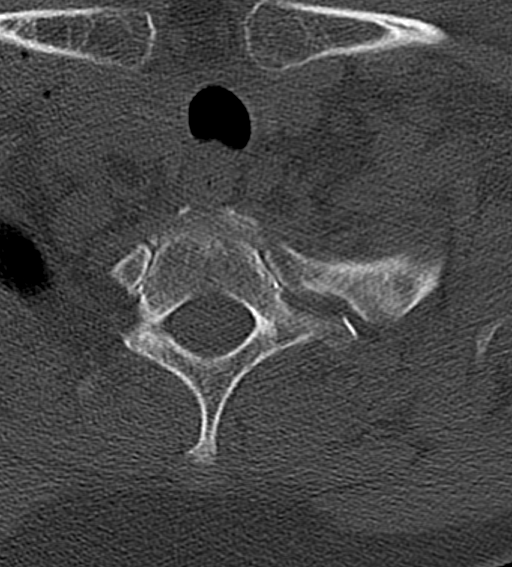
[im 31/91  bone]
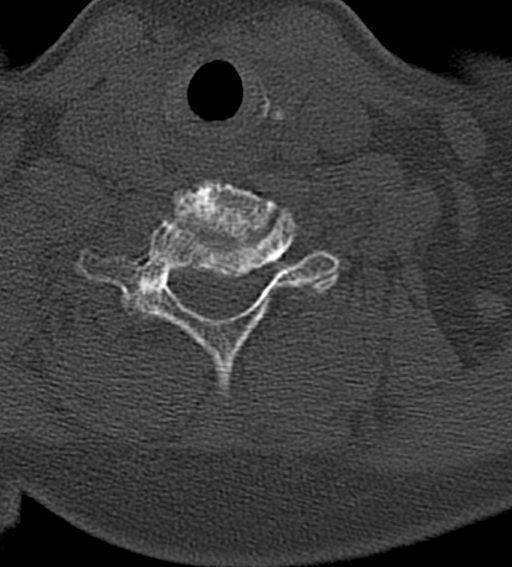
[im 41/91  bone]
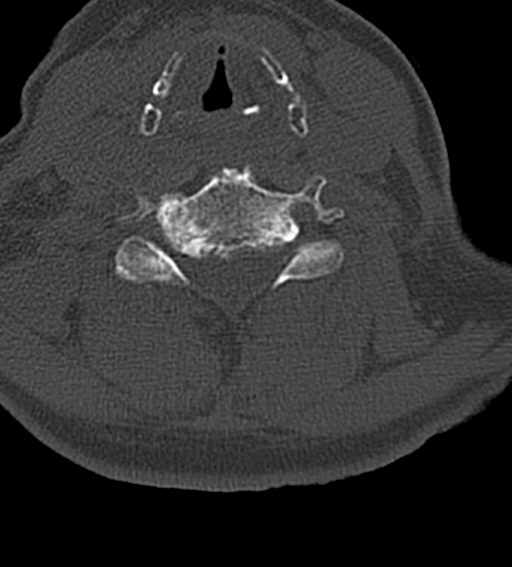
[im 51/91  bone]
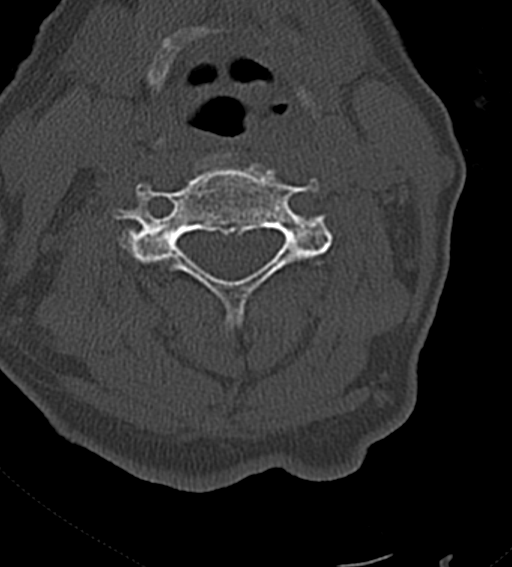
[im 61/91  bone]
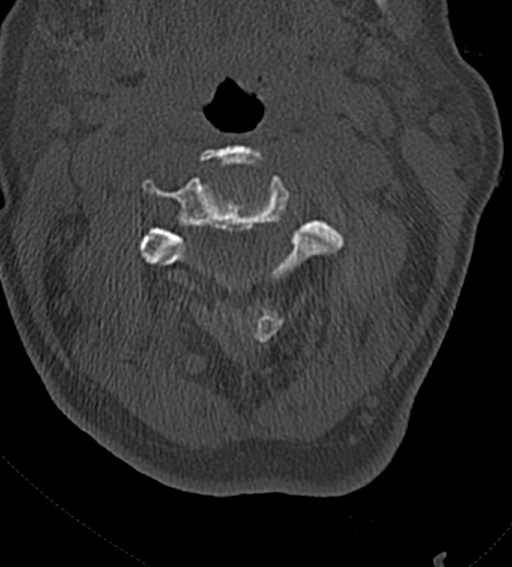
[im 71/91  bone]
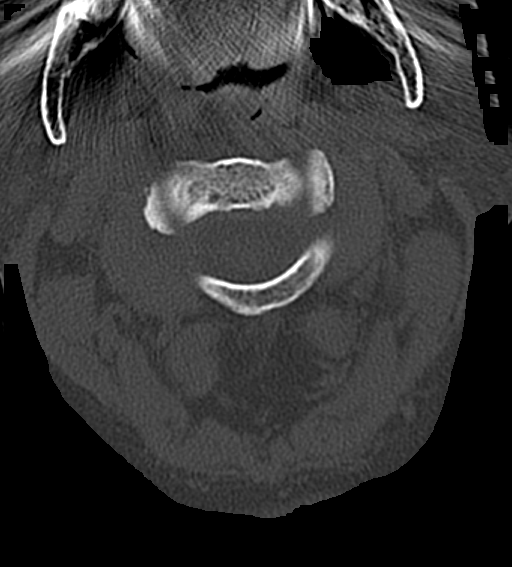
[im 81/91  bone]
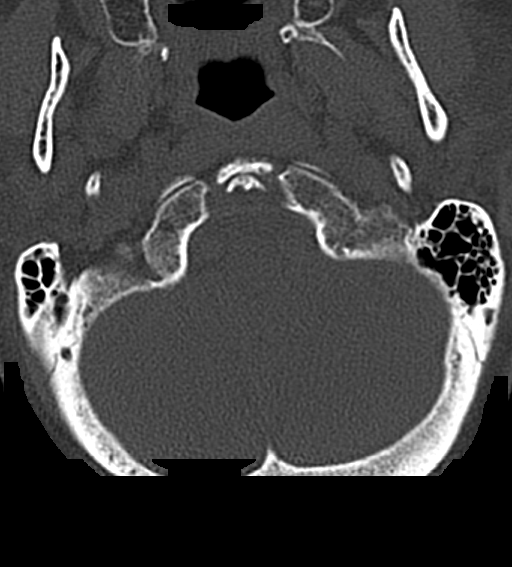

[Series 11: coronal bone · coronal · 0.23mm/px · 2 of 61 slices shown]
[im 59/61  brain]
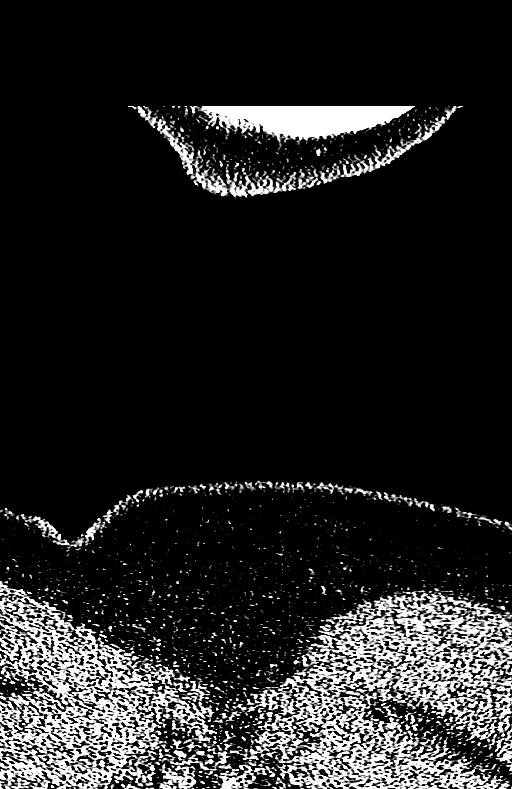
[im 60/61  brain]
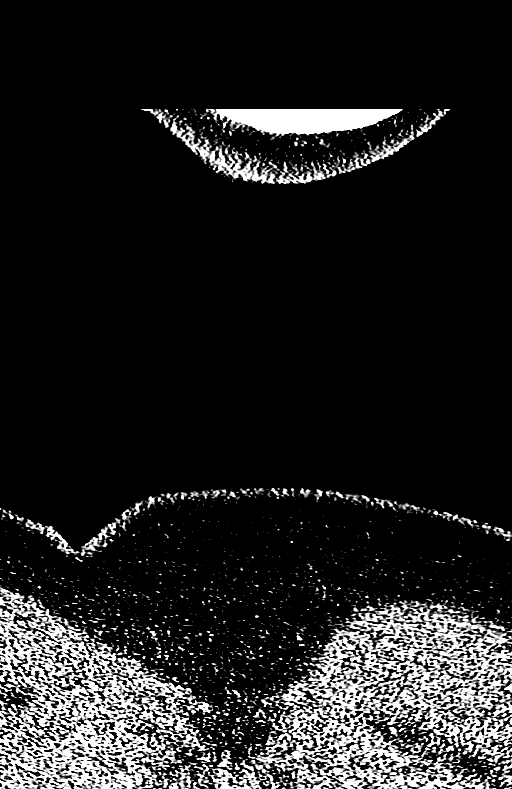

[14 of 42 positions shown; findings below may reference images not displayed]

FINDINGS: CT HEAD FINDINGS

Brain: There is no evidence of acute intracranial hemorrhage,
extra-axial fluid collection, or acute infarct.

There is mild global parenchymal volume loss. The ventricles are
stable in size. Patchy hypodensity in the subcortical and
periventricular white matter likely reflects sequela of chronic
white matter microangiopathy. There is no solid mass lesion. There
is no midline shift.

Vascular: No hyperdense vessel or unexpected calcification.

Skull: Normal. Negative for fracture or focal lesion.

Sinuses/Orbits: The imaged paranasal sinuses are clear. Bilateral
lens implants are in place. The globes and orbits are otherwise
unremarkable.

Other: None.

CT CERVICAL SPINE FINDINGS

Alignment: Normal.

Skull base and vertebrae: Skull base alignment is maintained.
Vertebral body heights are preserved. There is no evidence of acute
fracture.

Soft tissues and spinal canal: No prevertebral fluid or swelling. No
visible canal hematoma.

Disc levels: There is multilevel intervertebral disc space narrowing
with associated degenerative endplate change, most advanced at C5-C6
and C6-C7. There is relatively mild multilevel facet arthropathy.
There is severe bilateral neural foraminal stenosis at C3-C4, on the
right at C4-C5, and on the left at C5-C6 and C6-C7.

Upper chest: There is mild scarring in the lung apices.

Other: None.
IMPRESSION: 1. No acute intracranial hemorrhage or calvarial fracture.
2. No acute fracture or traumatic malalignment of the cervical
spine.
3. Multilevel degenerative changes throughout the cervical spine as
above.

## 2022-07-12 ENCOUNTER — Ambulatory Visit: Payer: PRIVATE HEALTH INSURANCE | Admitting: Family Medicine

## 2022-07-12 DIAGNOSIS — E119 Type 2 diabetes mellitus without complications: Secondary | ICD-10-CM

## 2022-07-12 NOTE — Progress Notes (Deleted)
   Established Patient Office Visit  Subjective   Patient ID: Kimberly Hines, female    DOB: 11/06/1940  Age: 82 y.o. MRN: [id number]  No chief complaint on file.   HPI   Hypertension- Pt denies chest pain, SOB, dizziness, or heart palpitations.  Taking meds as directed w/o problems.  Denies medication side effects.    Diabetes - no hypoglycemic events. No wounds or sores that are not healing well. No increased thirst or urination. Checking glucose at home. Taking medications as prescribed without any side effects.  {History (Optional):23778}  ROS    Objective:     There were no vitals taken for this visit. {Vitals History (Optional):23777}  Physical Exam Vitals and nursing note reviewed.  Constitutional:      Appearance: She is well-developed.  HENT:     Head: Normocephalic and atraumatic.  Cardiovascular:     Rate and Rhythm: Normal rate and regular rhythm.     Heart sounds: Normal heart sounds.  Pulmonary:     Effort: Pulmonary effort is normal.     Breath sounds: Normal breath sounds.  Skin:    General: Skin is warm and dry.  Neurological:     Mental Status: She is alert and oriented to person, place, and time.  Psychiatric:        Behavior: Behavior normal.    No results found for any visits on 07/12/22.  {Labs (Optional):23779}  The ASCVD Risk score (Arnett DK, et al., 2019) failed to calculate for the following reasons:   The 2019 ASCVD risk score is only valid for ages 54 to 65    Assessment & Plan:   Problem List Items Addressed This Visit       Cardiovascular and Mediastinum   Hypertension     Endocrine   Type 2 diabetes mellitus without complication, without long-term current use of insulin ([place]) - Primary    No follow-ups on file.    Beatrice Lecher, MD

## 2022-07-13 ENCOUNTER — Telehealth: Payer: Self-pay | Admitting: Family Medicine

## 2022-07-13 DIAGNOSIS — F4321 Adjustment disorder with depressed mood: Secondary | ICD-10-CM

## 2022-07-13 NOTE — Telephone Encounter (Signed)
Per daughter called the patient spouse passed away last night. The daughter will be handling all her medications because she has dementia and having a tough time with this grieving can she be given something? Also wants to know if she can come and get a copy of all her medications and when she should be taking them as well?  The daughter said she can come pick up if needed. Call daughter [phone] if needed.

## 2022-07-14 MED ORDER — CLONAZEPAM 0.5 MG PO TABS: 0.5000 mg | ORAL_TABLET | Freq: Two times a day (BID) | ORAL | 0 refills | Status: DC | PRN

## 2022-07-14 MED ORDER — DOXYCYCLINE HYCLATE 100 MG PO TABS: 100.0000 mg | ORAL_TABLET | Freq: Two times a day (BID) | ORAL | 0 refills | Status: DC

## 2022-07-14 NOTE — Telephone Encounter (Signed)
Has had the cough for the last few days. Sounds like her usual bronchitis. DID VERIFY with her daughter Lattie Haw that her reaction to the valium was syncope. She was give the medication for a procedure and hadn't eaten and passe out. No shortness of breath or rash.   She has picked up the medication list.   Meds ordered this encounter  Medications   doxycycline (VIBRA-TABS) 100 MG tablet    Sig: Take 1 tablet (100 mg total) by mouth 2 (two) times daily.    Dispense:  14 tablet    Refill:  0   clonazePAM (KLONOPIN) 0.5 MG tablet    Sig: Take 1 tablet (0.5 mg total) by mouth 2 (two) times daily as needed for anxiety.    Dispense:  4 tablet    Refill:  0

## 2022-07-15 ENCOUNTER — Telehealth: Payer: Self-pay | Admitting: Family Medicine

## 2022-07-15 MED ORDER — BENZONATATE 200 MG PO CAPS: 200.0000 mg | ORAL_CAPSULE | Freq: Three times a day (TID) | ORAL | 0 refills | Status: DC | PRN

## 2022-07-15 NOTE — Telephone Encounter (Signed)
Patient's daughter called to say her mom is not feeling any better she is coughing continually and is requesting something to help her out still feels really bad she would like a phone call back

## 2022-07-15 NOTE — Telephone Encounter (Signed)
I would recommend Robitussin cough medication.  I can send over [facility] but I cannot give her anything stronger without actually seeing her.  If she is not improving then we may need to work her up further and see her in person.  Meds ordered this encounter  Medications   benzonatate (TESSALON) 200 MG capsule    Sig: Take 1 capsule (200 mg total) by mouth 3 (three) times daily as needed for cough.    Dispense:  30 capsule    Refill:  0

## 2022-07-15 NOTE — Addendum Note (Signed)
Addended by: Beatrice Lecher D on: 07/15/2022 05:26 PM   Modules accepted: Orders

## 2022-07-16 ENCOUNTER — Encounter: Payer: Self-pay | Admitting: Emergency Medicine

## 2022-07-16 ENCOUNTER — Ambulatory Visit
Admission: EM | Admit: 2022-07-16 | Discharge: 2022-07-16 | Disposition: A | Discharge status: Home / Self Care | Payer: Medicare Other | Attending: Urgent Care | Admitting: Urgent Care

## 2022-07-16 DIAGNOSIS — J4520 Mild intermittent asthma, uncomplicated: Secondary | ICD-10-CM | POA: Diagnosis not present

## 2022-07-16 DIAGNOSIS — J209 Acute bronchitis, unspecified: Secondary | ICD-10-CM

## 2022-07-16 MED ORDER — CETIRIZINE HCL 1 MG/ML PO SOLN: 5.0000 mg | Freq: Every day | ORAL | 0 refills | Status: DC

## 2022-07-16 MED ORDER — PREDNISONE 20 MG PO TABS: 40.0000 mg | ORAL_TABLET | Freq: Every day | ORAL | 0 refills | Status: DC

## 2022-07-16 MED ORDER — PROMETHAZINE-DM 6.25-15 MG/5ML PO SYRP: 2.5000 mL | ORAL_SOLUTION | Freq: Three times a day (TID) | ORAL | 0 refills | Status: DC | PRN

## 2022-07-16 NOTE — ED Provider Notes (Signed)
[place]  Note:  This document was prepared using Dragon voice recognition software and may include unintentional dictation errors.  MRN: [id number] DOB: [birth year]  Subjective:   Kimberly Hines is a 82 y.o. female presenting for 4-day history of acute onset recurrent productive cough, malaise and fatigue.  No chest pain, shortness of breath or wheezing.  The cough is not allowing the patient to rest and she is starting to have some headaches with her coughing.  She did see her PCP and was started on doxycycline, Tessalon Perles.  Patient states that she generally gets bronchitis every year around the same time each year.  She responds very well to the use of steroids and cough syrup.  No current facility-administered medications for this encounter.  Current Outpatient Medications:    amLODipine (NORVASC) 10 MG tablet, Take 10 mg by mouth daily., Disp: , Rfl:    aspirin EC 81 MG tablet, Take 81 mg by mouth daily. Swallow whole., Disp: , Rfl:    atorvastatin (LIPITOR) 80 MG tablet, Take 1 tablet (80 mg total) by mouth daily., Disp: 90 tablet, Rfl: 3   benzonatate (TESSALON) 200 MG capsule, Take 1 capsule (200 mg total) by mouth 3 (three) times daily as needed for cough. (Patient not taking: Reported on 07/16/2022), Disp: 30 capsule, Rfl: 0   calcium carbonate (OS-CAL - DOSED IN MG OF ELEMENTAL CALCIUM) 1250 (500 Ca) MG tablet, Take 600 mg by mouth., Disp: , Rfl:    Cholecalciferol (VITAMIN D3) 50 MCG (2000 UT) TABS, Take 1 tablet by mouth daily., Disp: , Rfl:    clonazePAM (KLONOPIN) 0.5 MG tablet, Take 1 tablet (0.5 mg total) by mouth 2 (two) times daily as needed for anxiety., Disp: 4 tablet, Rfl: 0   clopidogrel (PLAVIX) 75 MG tablet, Take 1 tablet (75 mg total) by mouth daily., Disp: 90 tablet, Rfl: 3   doxycycline (VIBRA-TABS) 100 MG tablet, Take 1 tablet (100 mg total) by mouth 2 (two) times daily., Disp: 14 tablet, Rfl: 0   escitalopram (LEXAPRO) 20 MG tablet,  Take 1 tablet (20 mg total) by mouth daily., Disp: 90 tablet, Rfl: 3   potassium bicarbonate (K-LYTE) 25 MEQ disintegrating tablet, Take 2 tablets (50 mEq total) by mouth daily., Disp: 180 tablet, Rfl: 1   rivastigmine (EXELON) 4.5 MG capsule, Take 1 capsule (4.5 mg total) by mouth in the morning and at bedtime., Disp: 180 capsule, Rfl: 3   Allergies  Allergen Reactions   Benicar [Olmesartan] Anaphylaxis and Nausea And Vomiting   Diazepam Other (See Comments)    Syncope   Latex Rash   Tape Rash    ACE BANDAGES    Past Medical History:  Diagnosis Date   Anxiety    Hyperlipidemia    Hypertension    Mild cognitive impairment    Type 2 diabetes mellitus without complication, without long-term current use of insulin ([place]) 05/26/2017     Past Surgical History:  Procedure Laterality Date   ABDOMINAL HYSTERECTOMY     BACK SURGERY     3 times   CHOLECYSTECTOMY      Family History  Problem Relation Age of Onset   Alzheimer's disease Mother    Cancer Father        [employer] logistics/support/administrative officer   Coronary artery disease Paternal Aunt    Diabetes Other     Social History   Tobacco Use   Smoking status: Never   Smokeless tobacco: Never  Vaping Use   Vaping  Use: Never used  Substance Use Topics   Alcohol use: Never   Drug use: Never    ROS   Objective:   Vitals: BP (!) 149/75 (BP Location: Left Arm)   Pulse 73   Temp 98.5 F (36.9 C) (Oral)   Resp 16   Ht '5\' 2"'$  (1.575 m)   Wt 160 lb 15 oz (73 kg)   SpO2 97%   BMI 29.44 kg/m   Physical Exam Constitutional:      General: She is not in acute distress.    Appearance: Normal appearance. She is well-developed. She is not ill-appearing, toxic-appearing or diaphoretic.  HENT:     Head: Normocephalic and atraumatic.     Nose: Nose normal.     Mouth/Throat:     Mouth: Mucous membranes are moist.  Eyes:     General: No scleral icterus.       Right eye: No discharge.        Left eye: No discharge.     Extraocular Movements:  Extraocular movements intact.  Cardiovascular:     Rate and Rhythm: Normal rate and regular rhythm.     Heart sounds: Normal heart sounds. No murmur heard.    No friction rub. No gallop.  Pulmonary:     Effort: Pulmonary effort is normal. No respiratory distress.     Breath sounds: No stridor. No wheezing, rhonchi or rales.  Chest:     Chest wall: No tenderness.  Skin:    General: Skin is warm and dry.  Neurological:     General: No focal deficit present.     Mental Status: She is alert and oriented to person, place, and time.  Psychiatric:        Mood and Affect: Mood normal.        Behavior: Behavior normal.     Assessment and Plan :   PDMP not reviewed this encounter.  1. Acute bronchitis, unspecified organism   2. Mild intermittent extrinsic asthma without complication     Patient refused chest x-ray.  Has a history of allergic asthma and therefore is reasonable to use prednisone.  Recommended maintaining supportive care.  Finish out doxycycline. Counseled patient on potential for adverse effects with medications prescribed/recommended today, ER and return-to-clinic precautions discussed, patient verbalized understanding.    Jaynee Eagles, [place] 07/16/22 1035

## 2022-07-16 NOTE — ED Triage Notes (Signed)
Barky cough since Tuesday  Started on antibiotics on Wed by PCP  Denies fever  Hx of  bronchitis Here w. 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

## 2022-07-16 NOTE — Telephone Encounter (Signed)
Pt's daughter called this morning and spoke to [place] our scheduler about bringing her mother in due to her experiencing CP. I advised her that she would need to be seen. Pt was offered an appointment for today but stated that they could not come in at the time that was given and that she would take her mother to the UC or ED.

## 2022-07-16 NOTE — Discharge Instructions (Addendum)
We will manage this as a recurrent bronchitis with prednisone. Finish the doxycycline. For sore throat or cough try using a honey-based tea. Use 3 teaspoons of honey with juice squeezed from half lemon. Place shaved pieces of ginger into 1/2-1 cup of water and warm over stove top. Then mix the ingredients and repeat every 4 hours as needed. Please take Tylenol '500mg'$ -'650mg'$  once every 6 hours for fevers, aches and pains. Hydrate very well with at least 2 liters (64 ounces) of water. Eat light meals such as soups (chicken and noodles, chicken wild rice, vegetable).  Do not eat any foods that you are allergic to.  Start an antihistamine like Zyrtec ('5mg'$  daily) for postnasal drainage, sinus congestion.  You can take this together with prednisone.  Use the cough medications as needed.

## 2022-07-19 ENCOUNTER — Encounter: Payer: Self-pay | Admitting: Physician Assistant

## 2022-07-19 ENCOUNTER — Ambulatory Visit (INDEPENDENT_AMBULATORY_CARE_PROVIDER_SITE_OTHER): Payer: Medicare Other | Admitting: Physician Assistant

## 2022-07-19 VITALS — BP 155/68 | HR 85 | Temp 98.4°F | Ht 62.0 in | Wt 163.1 lb

## 2022-07-19 DIAGNOSIS — J4541 Moderate persistent asthma with (acute) exacerbation: Secondary | ICD-10-CM

## 2022-07-19 DIAGNOSIS — F4321 Adjustment disorder with depressed mood: Secondary | ICD-10-CM

## 2022-07-19 MED ORDER — PREDNISONE 10 MG PO TABS: ORAL_TABLET | ORAL | 0 refills | Status: DC

## 2022-07-19 MED ORDER — IPRATROPIUM-ALBUTEROL 0.5-2.5 (3) MG/3ML IN SOLN: 3.0000 mL | RESPIRATORY_TRACT | 0 refills | Status: DC | PRN

## 2022-07-19 MED ORDER — HYDROCOD POLI-CHLORPHE POLI ER 10-8 MG/5ML PO SUER: 5.0000 mL | Freq: Two times a day (BID) | ORAL | 0 refills | Status: DC | PRN

## 2022-07-19 MED ORDER — IPRATROPIUM-ALBUTEROL 0.5-2.5 (3) MG/3ML IN SOLN
3.0000 mL | Freq: Once | RESPIRATORY_TRACT | Status: AC
  Administered 2022-07-19: 3 mL via RESPIRATORY_TRACT

## 2022-07-19 NOTE — Progress Notes (Unsigned)
Established Patient Office Visit  Subjective   Patient ID: Kimberly Hines, female    DOB: June 27, 1940  Age: 82 y.o. MRN: [id number]  Chief Complaint  Patient presents with   Cough    Cough,shortness of breath     HPI Pt is a 82 yo female with history of asthma and yearly bronchitis who presents to the clinic with her daughter. Her cough is not improving. PCP gave her doxycycline and then went to UC on 07/15/2022 and was given prednisone and promethazine-dextromethorphan. She has had very little response. She continues to have productive cough. She could not sleep last night until her daughter let her use one of her albuterol nebulizers. No fever, chills, body aches. She does not have albuterol inhaler nor does she use daily inhalers.   She lost her husband last week suddenly.   .. Active Ambulatory Problems    Diagnosis Date Noted   Hyperlipidemia    Hypertension    Anxiety    Syncope and collapse 04/24/2021   Prolonged Q-T interval on ECG 04/24/2021   Hypokalemia 04/24/2021   B12 deficiency 06/05/2018   Asthma, allergic 12/14/2013   Type 2 diabetes mellitus without complication, without long-term current use of insulin ([place]) 05/26/2017   Hypocalcemia 04/24/2021   Chronic diarrhea 01/26/2022   Heart murmur 01/26/2022   TIA (transient ischemic attack) 03/26/2022   Anxiety and depression 12/20/2013   Cough 04/23/2019   DDD (degenerative disc disease), lumbosacral 12/14/2013   Elevated alkaline phosphatase level 12/21/2018   Elevated AST (SGOT) 12/02/2017   [place] cardiac risk >20% in next 10 years 06/05/2018   Hearing loss of right ear 08/06/2014   History of colonic polyps 08/03/2015   Mild cognitive impairment of uncertain or unknown etiology 09/08/2018   Nonsmoker 06/05/2018   Osteopenia 12/14/2013   Osteoporosis screening 01/28/2015   Paroxysmal atrial fibrillation ([place]) 12/04/2021   Prediabetes 12/14/2013   S/P lumbar fusion 11/15/2017   Sciatica 12/14/2013    Lumbar radiculopathy 08/19/2017   Viral upper respiratory tract infection 04/23/2019   Grief 07/19/2022   Resolved Ambulatory Problems    Diagnosis Date Noted   No Resolved Ambulatory Problems   Past Medical History:  Diagnosis Date   Mild cognitive impairment      ROS See HPI.    Objective:     BP (!) 155/68 (BP Location: Right Arm, Patient Position: Sitting, Cuff Size: Normal)   Pulse 85   Temp 98.4 F (36.9 C) (Oral)   Ht '5\' 2"'$  (1.575 m)   Wt 163 lb 1.9 oz (74 kg)   SpO2 98%   BMI 29.84 kg/m  BP Readings from Last 3 Encounters:  07/19/22 (!) 155/68  07/16/22 (!) 149/75  03/26/22 133/70   Wt Readings from Last 3 Encounters:  07/19/22 163 lb 1.9 oz (74 kg)  07/16/22 160 lb 15 oz (73 kg)  03/26/22 161 lb 1.3 oz (73.1 kg)    Duoneb given in offfice today with great response.   Physical Exam Constitutional:      Appearance: Normal appearance.  HENT:     Head: Normocephalic.     Nose: Nose normal.     Mouth/Throat:     Mouth: Mucous membranes are moist.  Eyes:     Conjunctiva/sclera: Conjunctivae normal.  Neck:     Vascular: No carotid bruit.  Cardiovascular:     Rate and Rhythm: Normal rate and regular rhythm.     Pulses: Normal pulses.  Pulmonary:     Effort:  Pulmonary effort is normal.     Comments: Bilateral wheezing before nebulizer that turned into rhonchi and coarse breath sounds after nebulizer.  Musculoskeletal:     Cervical back: Normal range of motion and neck supple. No rigidity or tenderness.     Right lower leg: No edema.     Left lower leg: No edema.  Lymphadenopathy:     Cervical: No cervical adenopathy.  Neurological:     General: No focal deficit present.     Mental Status: She is alert.  Psychiatric:        Mood and Affect: Mood normal.       Assessment & Plan:  Marland KitchenMarland KitchenRaychelle was seen today for cough.  Diagnoses and all orders for this visit:  Moderate persistent asthmatic bronchitis with acute exacerbation -      ipratropium-albuterol (DUONEB) 0.5-2.5 (3) MG/3ML nebulizer solution 3 mL -     predniSONE (DELTASONE) 10 MG tablet; Take 2 tablets for 3 days then 1 tablet for 3 days then 1/2 tablet for 4 days. -     ipratropium-albuterol (DUONEB) 0.5-2.5 (3) MG/3ML SOLN; Take 3 mLs by nebulization every 4 (four) hours as needed (wheeze, SOB). -     chlorpheniramine-HYDROcodone (TUSSIONEX) 10-8 MG/5ML; Take 5 mLs by mouth every 12 (twelve) hours as needed for cough (cough, will cause drowsiness.).  Grief   No signs of bacterial infection  Finish doxycycline  Extended prednisone for a few days Duoneb given to use ever 2-4 hours for next 2-3 days Tussionex given for cough, discussed sedation warning with daughter and patient Follow up as needed or if symptoms change or worsen  Discussed grief counseling availability.     Iran Planas, PA-C

## 2022-07-19 NOTE — Patient Instructions (Signed)
Longer prednisone taper sent to pharmacy Duoneb every 4 hours as needed Tussionex for cough at bedtime  Acute Bronchitis, Adult  Acute bronchitis is sudden inflammation of the main airways (bronchi) that come off the windpipe (trachea) in the lungs. The swelling causes the airways to get smaller and make more mucus than normal. This can make it hard to breathe and can cause coughing or noisy breathing (wheezing). Acute bronchitis may last several weeks. The cough may last longer. Allergies, asthma, and exposure to smoke may make the condition worse. What are the causes? This condition can be caused by germs and by substances that irritate the lungs, including: Cold and flu viruses. The most common cause of this condition is the virus that causes the common cold. Bacteria. This is less common. Breathing in substances that irritate the lungs, including: Smoke from cigarettes and other forms of tobacco. Dust and pollen. Fumes from household cleaning products, gases, or burned fuel. Indoor or outdoor air pollution. What increases the risk? The following factors may make you more likely to develop this condition: A weak body's defense system, also called the immune system. A condition that affects your lungs and breathing, such as asthma. What are the signs or symptoms? Common symptoms of this condition include: Coughing. This may bring up clear, yellow, or green mucus from your lungs (sputum). Wheezing. Runny or stuffy nose. Having too much mucus in your lungs (chest congestion). Shortness of breath. Aches and pains, including sore throat or chest. How is this diagnosed? This condition is usually diagnosed based on: Your symptoms and medical history. A physical exam. You may also have other tests, including tests to rule out other conditions, such as pneumonia. These tests include: A test of lung function. Test of a mucus sample to look for the presence of bacteria. Tests to check the  oxygen level in your blood. Blood tests. Chest X-ray. How is this treated? Most cases of acute bronchitis clear up over time without treatment. Your health care provider may recommend: Drinking more fluids to help thin your mucus so it is easier to cough up. Taking inhaled medicine (inhaler) to improve air flow in and out of your lungs. Using a vaporizer or a humidifier. These are machines that add water to the air to help you breathe better. Taking a medicine that thins mucus and clears congestion (expectorant). Taking a medicine that prevents or stops coughing (cough suppressant). It is not common to take an antibiotic medicine for this condition. Follow these instructions at home:  Take over-the-counter and prescription medicines only as told by your health care provider. Use an inhaler, vaporizer, or humidifier as told by your health care provider. Take two teaspoons (10 mL) of honey at bedtime to lessen coughing at night. Drink enough fluid to keep your urine pale yellow. Do not use any products that contain nicotine or tobacco. These products include cigarettes, chewing tobacco, and vaping devices, such as e-cigarettes. If you need help quitting, ask your health care provider. Get plenty of rest. Return to your normal activities as told by your health care provider. Ask your health care provider what activities are safe for you. Keep all follow-up visits. This is important. How is this prevented? To lower your risk of getting this condition again: Wash your hands often with soap and water for at least 20 seconds. If soap and water are not available, use hand sanitizer. Avoid contact with people who have cold symptoms. Try not to touch your mouth, nose, or  eyes with your hands. Avoid breathing in smoke or chemical fumes. Breathing smoke or chemical fumes will make your condition worse. Get the flu shot every year. Contact a health care provider if: Your symptoms do not improve after  2 weeks. You have trouble coughing up the mucus. Your cough keeps you awake at night. You have a fever. Get help right away if you: Cough up blood. Feel pain in your chest. Have severe shortness of breath. Faint or keep feeling like you are going to faint. Have a severe headache. Have a fever or chills that get worse. These symptoms may represent a serious problem that is an emergency. Do not wait to see if the symptoms will go away. Get medical help right away. Call your local emergency services (911 in the [place] Do not drive yourself to the hospital. Summary Acute bronchitis is inflammation of the main airways (bronchi) that come off the windpipe (trachea) in the lungs. The swelling causes the airways to get smaller and make more mucus than normal. Drinking more fluids can help thin your mucus so it is easier to cough up. Take over-the-counter and prescription medicines only as told by your health care provider. Do not use any products that contain nicotine or tobacco. These products include cigarettes, chewing tobacco, and vaping devices, such as e-cigarettes. If you need help quitting, ask your health care provider. Contact a health care provider if your symptoms do not improve after 2 weeks. This information is not intended to replace advice given to you by your health care provider. Make sure you discuss any questions you have with your health care provider. Document Revised: 08/20/2021 Document Reviewed: 09/10/2020 Elsevier Patient Education  [employer].

## 2022-07-20 ENCOUNTER — Encounter: Payer: Self-pay | Admitting: Physician Assistant

## 2022-09-20 ENCOUNTER — Ambulatory Visit (INDEPENDENT_AMBULATORY_CARE_PROVIDER_SITE_OTHER): Payer: Medicare Other | Admitting: Family Medicine

## 2022-09-20 ENCOUNTER — Encounter: Payer: Self-pay | Admitting: Family Medicine

## 2022-09-20 VITALS — BP 132/72 | HR 64 | Ht 62.0 in | Wt 163.0 lb

## 2022-09-20 DIAGNOSIS — F4321 Adjustment disorder with depressed mood: Secondary | ICD-10-CM | POA: Diagnosis not present

## 2022-09-20 DIAGNOSIS — G3184 Mild cognitive impairment, so stated: Secondary | ICD-10-CM | POA: Diagnosis not present

## 2022-09-20 DIAGNOSIS — F419 Anxiety disorder, unspecified: Secondary | ICD-10-CM

## 2022-09-20 DIAGNOSIS — I10 Essential (primary) hypertension: Secondary | ICD-10-CM | POA: Diagnosis not present

## 2022-09-20 DIAGNOSIS — F32A Depression, unspecified: Secondary | ICD-10-CM

## 2022-09-20 DIAGNOSIS — E119 Type 2 diabetes mellitus without complications: Secondary | ICD-10-CM | POA: Diagnosis not present

## 2022-09-20 LAB — POCT GLYCOSYLATED HEMOGLOBIN (HGB A1C): Hemoglobin A1C: 7.5 % — AB (ref 4.0–5.6)

## 2022-09-20 MED ORDER — ESCITALOPRAM OXALATE 20 MG PO TABS: 20.0000 mg | ORAL_TABLET | Freq: Every day | ORAL | 0 refills | Status: DC

## 2022-09-20 MED ORDER — BUPROPION HCL ER (XL) 150 MG PO TB24: 150.0000 mg | ORAL_TABLET | Freq: Every day | ORAL | 1 refills | Status: DC

## 2022-09-20 MED ORDER — RIVASTIGMINE TARTRATE 6 MG PO CAPS: 6.0000 mg | ORAL_CAPSULE | Freq: Two times a day (BID) | ORAL | 3 refills | Status: DC

## 2022-09-20 NOTE — Assessment & Plan Note (Signed)
Vesely diet controlled her last A1c was 6.6 but it is up to 7.5 this time.  We discussed continuing to work on diet it sounds like she has not been eating regularly and has been eating sweets since her husband passed.  I really want her to try to work on getting in some more regular nutrition and eat more healthy food choices and cut back on some of the sweets.  Will follow-up in 3 months.  If not improved at that time then we will need to start medication.

## 2022-09-20 NOTE — Assessment & Plan Note (Signed)
We discussed some options.  We do have some room on the rivastigmine to go up to 6 mg.  New prescription sent to pharmacy.

## 2022-09-20 NOTE — Progress Notes (Addendum)
Established Patient Office Visit  Subjective   Patient ID: ELIE GRAGERT, female    DOB: May 11, 1941  Age: 82 y.o. MRN: [id number]  Chief Complaint  Patient presents with   Diabetes    HPI   Hypertension- Pt denies chest pain, SOB, dizziness, or heart palpitations.  Taking meds as directed w/o problems.  Denies medication side effects.    Diabetes - no hypoglycemic events. No wounds or sores that are not healing well. No increased thirst or urination. Checking glucose at home. Taking medications as prescribed without any side effects.  She is also been not sleeping well lately.  Sometimes she will stay up late reading.  But her daughter who is with her today says sometimes she also wakes up in the mornings and she is disoriented.  She feels like maybe her dementia is a little bit worse since her husband passed away a few months ago.  He also has not been eating consistently but just mostly eating sweets especially if she is feeling stressed but then sometimes not eating at all.  She is currently on Lexapro 20 mg.  She is just felt more down and she has had some increase in irritability even before her husband passed away.    ROS    Objective:     BP 132/72   Pulse 64   Ht 5\' 2"  (1.575 m)   Wt 163 lb (73.9 kg)   SpO2 100%   BMI 29.81 kg/m    Physical Exam Vitals and nursing note reviewed.  Constitutional:      Appearance: She is well-developed.  HENT:     Head: Normocephalic and atraumatic.  Cardiovascular:     Rate and Rhythm: Normal rate and regular rhythm.     Heart sounds: Normal heart sounds.  Pulmonary:     Effort: Pulmonary effort is normal.     Breath sounds: Normal breath sounds.  Skin:    General: Skin is warm and dry.  Neurological:     Mental Status: She is alert and oriented to person, place, and time.  Psychiatric:        Behavior: Behavior normal.      Results for orders placed or performed in visit on 09/20/22  POCT glycosylated hemoglobin  (Hb A1C)  Result Value Ref Range   Hemoglobin A1C 7.5 (A) 4.0 - 5.6 %   HbA1c POC (<> result, manual entry)     HbA1c, POC (prediabetic range)     HbA1c, POC (controlled diabetic range)        The ASCVD Risk score (Arnett DK, et al., 2019) failed to calculate for the following reasons:   The 2019 ASCVD risk score is only valid for ages 40 to 32    Assessment & Plan:   Problem List Items Addressed This Visit       Cardiovascular and Mediastinum   Hypertension - Primary   Relevant Orders   COMPLETE METABOLIC PANEL WITH GFR   Lipid Panel w/reflex Direct LDL   Urine Microalbumin w/creat. ratio   CBC     Endocrine   Type 2 diabetes mellitus without complication, without long-term current use of insulin ([facility])    Vesely diet controlled her last A1c was 6.6 but it is up to 7.5 this time.  We discussed continuing to work on diet it sounds like she has not been eating regularly and has been eating sweets since her husband passed.  I really want her to try to work on getting  in some more regular nutrition and eat more healthy food choices and cut back on some of the sweets.  Will follow-up in 3 months.  If not improved at that time then we will need to start medication.      Relevant Orders   COMPLETE METABOLIC PANEL WITH GFR   Lipid Panel w/reflex Direct LDL   Urine Microalbumin w/creat. ratio   POCT glycosylated hemoglobin (Hb A1C) (Completed)   CBC     Other   Mild cognitive impairment of uncertain or unknown etiology    We discussed some options.  We do have some room on the rivastigmine to go up to 6 mg.  New prescription sent to pharmacy.      Relevant Medications   rivastigmine (EXELON) 6 MG capsule   Other Relevant Orders   CBC   Grief   Anxiety and depression    Gust options including therapy/counseling, changing medications completely, or doing add-on therapy with Wellbutrin.  She would prefer to try the add-on therapy for a while with Wellbutrin.  Follow-up in 6 to 7  weeks.      Relevant Medications   buPROPion (WELLBUTRIN XL) 150 MG 24 hr tablet   escitalopram (LEXAPRO) 20 MG tablet   Grief-Discussed possible referral for therapy/counseling.  Return in about 6 weeks (around 11/01/2022) for New start medication.    Nani Gasser, MD

## 2022-09-20 NOTE — Addendum Note (Signed)
Addended by: Nani Gasser D on: 09/20/2022 10:24 AM   Modules accepted: Orders

## 2022-09-20 NOTE — Assessment & Plan Note (Signed)
Gust options including therapy/counseling, changing medications completely, or doing add-on therapy with Wellbutrin.  She would prefer to try the add-on therapy for a while with Wellbutrin.  Follow-up in 6 to 7 weeks.

## 2022-09-21 LAB — COMPLETE METABOLIC PANEL WITH GFR
AG Ratio: 1.7 (calc) (ref 1.0–2.5)
ALT: 11 U/L (ref 6–29)
AST: 11 U/L (ref 10–35)
Albumin: 4.1 g/dL (ref 3.6–5.1)
Alkaline phosphatase (APISO): 94 U/L (ref 37–153)
BUN: 11 mg/dL (ref 7–25)
CO2: 30 mmol/L (ref 20–32)
Calcium: 10.3 mg/dL (ref 8.6–10.4)
Chloride: 103 mmol/L (ref 98–110)
Creat: 0.95 mg/dL (ref 0.60–0.95)
Globulin: 2.4 g/dL (calc) (ref 1.9–3.7)
Glucose, Bld: 176 mg/dL — ABNORMAL HIGH (ref 65–99)
Potassium: 3.3 mmol/L — ABNORMAL LOW (ref 3.5–5.3)
Sodium: 141 mmol/L (ref 135–146)
Total Bilirubin: 0.5 mg/dL (ref 0.2–1.2)
Total Protein: 6.5 g/dL (ref 6.1–8.1)
eGFR: 61 mL/min/{1.73_m2} (ref >=60)

## 2022-09-21 LAB — CBC
HCT: 43.4 % (ref 35.0–45.0)
Hemoglobin: 14.3 g/dL (ref 11.7–15.5)
MCH: 29.1 pg (ref 27.0–33.0)
MCHC: 32.9 g/dL (ref 32.0–36.0)
MCV: 88.4 fL (ref 80.0–100.0)
MPV: 10.9 fL (ref 7.5–12.5)
Platelets: 271 10*3/uL (ref 140–400)
RBC: 4.91 10*6/uL (ref 3.80–5.10)
RDW: 13.4 % (ref 11.0–15.0)
WBC: 7.3 10*3/uL (ref 3.8–10.8)

## 2022-09-21 LAB — LIPID PANEL W/REFLEX DIRECT LDL
Cholesterol: 131 mg/dL (ref <200)
HDL: 61 mg/dL (ref >=50)
LDL Cholesterol (Calc): 54 mg/dL (calc)
Non-HDL Cholesterol (Calc): 70 mg/dL (calc) (ref <130)
Total CHOL/HDL Ratio: 2.1 (calc) (ref <5.0)
Triglycerides: 77 mg/dL (ref <150)

## 2022-09-21 NOTE — Progress Notes (Signed)
Okay, please have her increase to twice a day 1 in the morning and 1 around lunchtime.  I like to recheck her potassium in about 2 weeks on the increased dose.

## 2022-09-21 NOTE — Progress Notes (Signed)
Hi Kimberly Hines, your potassium is still a little low.  Are you taking your potassium twice a day? All other labs look good.

## 2022-09-29 ENCOUNTER — Ambulatory Visit: Payer: Medicare Other | Admitting: Family Medicine

## 2022-09-29 DIAGNOSIS — Z0000 Encounter for general adult medical examination without abnormal findings: Secondary | ICD-10-CM

## 2022-09-29 NOTE — Patient Instructions (Addendum)
MEDICARE ANNUAL WELLNESS VISIT Health Maintenance Summary and Written Plan of Care  [name] ,  Thank you for allowing me to perform your Medicare Annual Wellness Visit and for your ongoing commitment to your health.   Health Maintenance & Immunization History Health Maintenance  Topic Date Due   Diabetic kidney evaluation - Urine ACR  10/01/2022 (Originally [birth year]/1960)   OPHTHALMOLOGY EXAM  05/24/2023 (Originally [birth year]1952)   Zoster Vaccines- Shingrix (1 of 2) 05/24/2023 (Originally 04/23/1992)   COVID-19 Vaccine (1) 10/05/2024 (Originally 10/23/1941)   INFLUENZA VACCINE  12/23/2022   HEMOGLOBIN A1C  03/22/2023   MAMMOGRAM  08/05/2023   Diabetic kidney evaluation - eGFR measurement  09/20/2023   FOOT EXAM  09/20/2023   Medicare Annual Wellness (AWV)  09/29/2023   DTaP/Tdap/Td (3 - Td or Tdap) 04/25/2031   Pneumonia Vaccine 39+ Years old  Completed   DEXA SCAN  Completed   HPV VACCINES  Aged Out   Immunization History  Administered Date(s) Administered   Influenza, High Dose Seasonal PF 02/24/2015, 04/05/2018, 03/25/2020   Influenza-Unspecified 04/02/2004, 03/08/2006, 03/25/2007, 02/21/2009, 02/21/2010, 02/21/2014, 03/09/2016   Pneumococcal Conjugate-13 01/28/2015   Pneumococcal Polysaccharide-23 07/09/2005, 07/12/2005, 12/18/2013, 06/09/2021   Td (Adult),unspecified 11/28/2007   Tdap 12/02/2017, 04/24/2021    These are the patient goals that we discussed:  Goals Addressed               This Visit's Progress     Patient Stated (pt-stated)        Patient stated she would like to continue to maintain her current lifestyle.         This is a list of Health Maintenance Items that are overdue or due now: Urine ACR Eye exam Shingles vaccine    Orders/Referrals Placed Today: No orders of the defined types were placed in this encounter.  (Contact our referral department at [phone] if you have not spoken with someone about your referral appointment within the  next 5 days)    Follow-up Plan Follow-up with Agapito Games, MD as planned Schedule shingles vaccine at the pharmacy.  Urine ACR can be completed at the next visit.  Please schedule your eye exam. Medicare wellness visit in one year.  AVS printed and mailed to the patient.      Health Maintenance, Female Adopting a healthy lifestyle and getting preventive care are important in promoting health and wellness. Ask your health care provider about: The right schedule for you to have regular tests and exams. Things you can do on your own to prevent diseases and keep yourself healthy. What should I know about diet, weight, and exercise? Eat a healthy diet  Eat a diet that includes plenty of vegetables, fruits, low-fat dairy products, and lean protein. Do not eat a lot of foods that are high in solid fats, added sugars, or sodium. Maintain a healthy weight Body mass index (BMI) is used to identify weight problems. It estimates body fat based on height and weight. Your health care provider can help determine your BMI and help you achieve or maintain a healthy weight. Get regular exercise Get regular exercise. This is one of the most important things you can do for your health. Most adults should: Exercise for at least 150 minutes each week. The exercise should increase your heart rate and make you sweat (moderate-intensity exercise). Do strengthening exercises at least twice a week. This is in addition to the moderate-intensity exercise. Spend less time sitting. Even light physical activity can be beneficial. Watch  cholesterol and blood lipids Have your blood tested for lipids and cholesterol at 81 years of age, then have this test every 5 years. Have your cholesterol levels checked more often if: Your lipid or cholesterol levels are high. You are older than 81 years of age. You are at high risk for heart disease. What should I know about cancer screening? Depending on your health  history and family history, you may need to have cancer screening at various ages. This may include screening for: Breast cancer. Cervical cancer. Colorectal cancer. Skin cancer. Lung cancer. What should I know about heart disease, diabetes, and high blood pressure? Blood pressure and heart disease High blood pressure causes heart disease and increases the risk of stroke. This is more likely to develop in people who have high blood pressure readings or are overweight. Have your blood pressure checked: Every 3-5 years if you are 40-16 years of age. Every year if you are 56 years old or older. Diabetes Have regular diabetes screenings. This checks your fasting blood sugar level. Have the screening done: Once every three years after age 26 if you are at a normal weight and have a low risk for diabetes. More often and at a younger age if you are overweight or have a high risk for diabetes. What should I know about preventing infection? Hepatitis B If you have a higher risk for hepatitis B, you should be screened for this virus. Talk with your health care provider to find out if you are at risk for hepatitis B infection. Hepatitis C Testing is recommended for: Everyone born from [birth year] through 1965. Anyone with known risk factors for hepatitis C. Sexually transmitted infections (STIs) Get screened for STIs, including gonorrhea and chlamydia, if: You are sexually active and are younger than 81 years of age. You are older than 81 years of age and your health care provider tells you that you are at risk for this type of infection. Your sexual activity has changed since you were last screened, and you are at increased risk for chlamydia or gonorrhea. Ask your health care provider if you are at risk. Ask your health care provider about whether you are at high risk for HIV. Your health care provider may recommend a prescription medicine to help prevent HIV infection. If you choose to take medicine to  prevent HIV, you should first get tested for HIV. You should then be tested every 3 months for as long as you are taking the medicine. Pregnancy If you are about to stop having your period (premenopausal) and you may become pregnant, seek counseling before you get pregnant. Take 400 to 800 micrograms (mcg) of folic acid every day if you become pregnant. Ask for birth control (contraception) if you want to prevent pregnancy. Osteoporosis and menopause Osteoporosis is a disease in which the bones lose minerals and strength with aging. This can result in bone fractures. If you are 32 years old or older, or if you are at risk for osteoporosis and fractures, ask your health care provider if you should: Be screened for bone loss. Take a calcium or vitamin D supplement to lower your risk of fractures. Be given hormone replacement therapy (HRT) to treat symptoms of menopause. Follow these instructions at home: Alcohol use Do not drink alcohol if: Your health care provider tells you not to drink. You are pregnant, may be pregnant, or are planning to become pregnant. If you drink alcohol: Limit how much you have to: 0-1 drink a day.  Know how much alcohol is in your drink. In the [place] one drink equals one 12 oz bottle of beer (355 mL), one 5 oz glass of wine (148 mL), or one 1 oz glass of hard liquor (44 mL). Lifestyle Do not use any products that contain nicotine or tobacco. These products include cigarettes, chewing tobacco, and vaping devices, such as e-cigarettes. If you need help quitting, ask your health care provider. Do not use street drugs. Do not share needles. Ask your health care provider for help if you need support or information about quitting drugs. General instructions Schedule regular health, dental, and eye exams. Stay current with your vaccines. Tell your health care provider if: You often feel depressed. You have ever been abused or do not feel safe at  home. Summary Adopting a healthy lifestyle and getting preventive care are important in promoting health and wellness. Follow your health care provider's instructions about healthy diet, exercising, and getting tested or screened for diseases. Follow your health care provider's instructions on monitoring your cholesterol and blood pressure. This information is not intended to replace advice given to you by your health care provider. Make sure you discuss any questions you have with your health care provider. Document Revised: 09/29/2020 Document Reviewed: 09/29/2020 Elsevier Patient Education  2023 [employer].

## 2022-09-29 NOTE — Progress Notes (Signed)
MEDICARE ANNUAL WELLNESS VISIT  09/29/2022  Telephone Visit Disclaimer This Medicare AWV was conducted by telephone due to national recommendations for restrictions regarding the COVID-19 Pandemic (e.g. social distancing).  I verified, using two identifiers, that I am speaking with Pollyann Glen or their authorized healthcare agent. I discussed the limitations, risks, security, and privacy concerns of performing an evaluation and management service by telephone and the potential availability of an in-person appointment in the future. The patient expressed understanding and agreed to proceed.  Location of Patient: Home with daughter Location of Provider (nurse):  In the office.  Subjective:    DAMIYA GUIZA is a 82 y.o. female patient of Metheney, Barbarann Ehlers, MD who had a Medicare Annual Wellness Visit today via telephone. Gita is Retired and lives alone. she has 2 children. she reports that she is socially active and does interact with friends/family regularly. she is moderately physically active and enjoys yard work and staying active.  Patient Care Team: Agapito Games, MD as PCP - General (Family Medicine)     09/29/2022    9:36 AM 01/26/2022    5:00 PM 04/24/2021   10:17 AM  Advanced Directives  Does Patient Have a Medical Advance Directive? Yes No No  Type of Advance Directive Living will    Does patient want to make changes to medical advance directive? No - Patient declined    Would patient like information on creating a medical advance directive?   No - Patient declined    Hospital Utilization Over the Past 12 Months: # of hospitalizations or ER visits: 0 # of surgeries: 0  Review of Systems    Patient reports that her overall health is unchanged compared to last year.  History obtained from chart review and the patient and her daughter, Misty Stanley.   Patient Reported Readings (BP, Pulse, CBG, Weight, etc) none  Pain Assessment Pain : No/denies pain      Current Medications & Allergies (verified) Allergies as of 09/29/2022       Reactions   Benicar [olmesartan] Anaphylaxis, Nausea And Vomiting   Diazepam Other (See Comments)   Syncope   Latex Rash   Tape Rash   ACE BANDAGES        Medication List        Accurate as of Sep 29, 2022  9:53 AM. If you have any questions, ask your nurse or doctor.          amLODipine 10 MG tablet Commonly known as: NORVASC Take 10 mg by mouth daily.   aspirin EC 81 MG tablet Take 81 mg by mouth daily. Swallow whole.   atorvastatin 80 MG tablet Commonly known as: LIPITOR Take 1 tablet (80 mg total) by mouth daily.   buPROPion 150 MG 24 hr tablet Commonly known as: Wellbutrin XL Take 1 tablet (150 mg total) by mouth daily.   calcium carbonate 1250 (500 Ca) MG tablet Commonly known as: OS-CAL - dosed in mg of elemental calcium Take 600 mg by mouth.   cetirizine HCl 1 MG/ML solution Commonly known as: ZYRTEC Take 5 mLs (5 mg total) by mouth daily.   clopidogrel 75 MG tablet Commonly known as: PLAVIX Take 1 tablet (75 mg total) by mouth daily.   escitalopram 20 MG tablet Commonly known as: Lexapro Take 1 tablet (20 mg total) by mouth daily.   ipratropium-albuterol 0.5-2.5 (3) MG/3ML Soln Commonly known as: DUONEB Take 3 mLs by nebulization every 4 (four) hours as needed (wheeze,  SOB).   potassium bicarbonate 25 MEQ disintegrating tablet Commonly known as: K-LYTE Take 2 tablets (50 mEq total) by mouth daily.   rivastigmine 6 MG capsule Commonly known as: EXELON Take 1 capsule (6 mg total) by mouth in the morning and at bedtime.   Vitamin D3 50 MCG (2000 UT) Tabs Take 1 tablet by mouth daily.        History (reviewed): Past Medical History:  Diagnosis Date   Anxiety    Hyperlipidemia    Hypertension    Mild cognitive impairment    Type 2 diabetes mellitus without complication, without long-term current use of insulin (HCC) 05/26/2017   Past Surgical History:   Procedure Laterality Date   ABDOMINAL HYSTERECTOMY     BACK SURGERY     3 times   CHOLECYSTECTOMY     Family History  Problem Relation Age of Onset   Alzheimer's disease Mother    Cancer Father        Education officer, environmental   Coronary artery disease Paternal Aunt    Diabetes Other    Social History   Socioeconomic History   Marital status: Widowed    Spouse name: Not on file   Number of children: 2   Years of education: 26   Highest education level: 12th grade  Occupational History   Occupation: Retired  Tobacco Use   Smoking status: Never   Smokeless tobacco: Never  Vaping Use   Vaping Use: Never used  Substance and Sexual Activity   Alcohol use: Never   Drug use: Never   Sexual activity: Yes    Partners: Male    Birth control/protection: Other-see comments    Comment: hysterectomy  Other Topics Concern   Not on file  Social History Narrative   Lives alone. Her husband recently passed away. She sees her daughter often. She enjoys doing yard work and staying active.   Social Determinants of Health   Financial Resource Strain: Low Risk  (09/29/2022)   Overall Financial Resource Strain (CARDIA)    Difficulty of Paying Living Expenses: Not hard at all  Food Insecurity: No Food Insecurity (09/29/2022)   Hunger Vital Sign    Worried About Running Out of Food in the Last Year: Never true    Ran Out of Food in the Last Year: Never true  Transportation Needs: No Transportation Needs (09/29/2022)   PRAPARE - Administrator, Civil Service (Medical): No    Lack of Transportation (Non-Medical): No  Physical Activity: Insufficiently Active (09/29/2022)   Exercise Vital Sign    Days of Exercise per Week: 1 day    Minutes of Exercise per Session: 120 min  Stress: No Stress Concern Present (09/29/2022)   [employer] of Occupational Health - Occupational Stress Questionnaire    Feeling of Stress : Not at all  Social Connections: Moderately Isolated (09/29/2022)   Social  Connection and Isolation Panel [NHANES]    Frequency of Communication with Friends and Family: More than three times a week    Frequency of Social Gatherings with Friends and Family: More than three times a week    Attends Religious Services: More than 4 times per year    Active Member of [employer] or Organizations: No    Attends Banker Meetings: Never    Marital Status: Widowed    Activities of Daily Living    09/29/2022    9:41 AM  In your present state of health, do you have any difficulty performing the  following activities:  Hearing? 0  Vision? 0  Difficulty concentrating or making decisions? 0  Comment sometimes  Walking or climbing stairs? 0  Dressing or bathing? 0  Doing errands, shopping? 0  Preparing Food and eating ? N  Using the Toilet? N  In the past six months, have you accidently leaked urine? N  Do you have problems with loss of bowel control? N  Managing your Medications? N  Managing your Finances? N  Housekeeping or managing your Housekeeping? N    Patient Education/ Literacy How often do you need to have someone help you when you read instructions, pamphlets, or other written materials from your doctor or pharmacy?: 1 - Never What is the last grade level you completed in school?: 12th grade  Exercise Current Exercise Habits: Home exercise routine, Type of exercise: Other - see comments (mowing the yard), Time (Minutes): > 60, Frequency (Times/Week): 1, Weekly Exercise (Minutes/Week): 0, Intensity: Moderate, Exercise limited by: None identified  Diet Patient reports consuming 2 meals a day and 1 snack(s) a day Patient reports that her primary diet is: Regular Patient reports that she does have regular access to food.   Depression Screen    09/29/2022    9:36 AM 09/20/2022   10:37 AM 03/09/2022   10:39 AM 01/26/2022    5:00 PM  PHQ 2/9 Scores  PHQ - 2 Score 0 2 0 6  PHQ- 9 Score  11  21     Fall Risk    09/29/2022    9:36 AM 09/20/2022     9:55 AM 07/19/2022    2:30 PM 03/09/2022   10:39 AM 01/26/2022    5:00 PM  Fall Risk   Falls in the past year? 0 0 1 0 0  Number falls in past yr: 0 0 1 0 0  Injury with Fall? 0 0 0 0 0  Risk for fall due to : No Fall Risks No Fall Risks History of fall(s)  No Fall Risks  Follow up Falls evaluation completed Falls evaluation completed Falls evaluation completed;Falls prevention discussed       Objective:  SAMYIA TARAN seemed alert and oriented and she participated appropriately during our telephone visit.  Blood Pressure Weight BMI  BP Readings from Last 3 Encounters:  09/20/22 132/72  07/19/22 (!) 155/68  07/16/22 (!) 149/75   Wt Readings from Last 3 Encounters:  09/20/22 163 lb (73.9 kg)  07/19/22 163 lb 1.9 oz (74 kg)  07/16/22 160 lb 15 oz (73 kg)   BMI Readings from Last 1 Encounters:  09/20/22 29.81 kg/m    *Unable to obtain current vital signs, weight, and BMI due to telephone visit type  Hearing/Vision  Chris did not seem to have difficulty with hearing/understanding during the telephone conversation Reports that she has not had a formal eye exam by an eye care professional within the past year Reports that she has not had a formal hearing evaluation within the past year *Unable to fully assess hearing and vision during telephone visit type  Cognitive Function:    09/29/2022    9:43 AM  6CIT Screen  What Year? 4 points  What month? 3 points  What time? 0 points  Count back from 20 0 points  Months in reverse 2 points  Repeat phrase 10 points  Total Score 19 points   (Normal:0-7, Significant for Dysfunction: >8)  Normal Cognitive Function Screening: No. Her daughter stated that she is on medication for her memory loss.  Immunization & Health Maintenance Record Immunization History  Administered Date(s) Administered   Influenza, High Dose Seasonal PF 02/24/2015, 04/05/2018, 03/25/2020   Influenza-Unspecified 04/02/2004, 03/08/2006, 03/25/2007,  02/21/2009, 02/21/2010, 02/21/2014, 03/09/2016   Pneumococcal Conjugate-13 01/28/2015   Pneumococcal Polysaccharide-23 07/09/2005, 07/12/2005, 12/18/2013, 06/09/2021   Td (Adult),unspecified 11/28/2007   Tdap 12/02/2017, 04/24/2021    Health Maintenance  Topic Date Due   Diabetic kidney evaluation - Urine ACR  10/01/2022 (Originally 04/23/1959)   OPHTHALMOLOGY EXAM  05/24/2023 (Originally 04/23/1951)   Zoster Vaccines- Shingrix (1 of 2) 05/24/2023 (Originally 04/23/1992)   COVID-19 Vaccine (1) 10/05/2024 (Originally 10/23/1941)   INFLUENZA VACCINE  12/23/2022   HEMOGLOBIN A1C  03/22/2023   MAMMOGRAM  08/05/2023   Diabetic kidney evaluation - eGFR measurement  09/20/2023   FOOT EXAM  09/20/2023   Medicare Annual Wellness (AWV)  09/29/2023   DTaP/Tdap/Td (3 - Td or Tdap) 04/25/2031   Pneumonia Vaccine 36+ Years old  Completed   DEXA SCAN  Completed   HPV VACCINES  Aged Out       Assessment  This is a routine wellness examination for [employer].  Health Maintenance: Due or Overdue There are no preventive care reminders to display for this patient.   Pollyann Glen does not need a referral for Community Assistance: Care Management:   no Social Work:    no Prescription Assistance:  no Nutrition/Diabetes Education:  no   Plan:  Personalized Goals  Goals Addressed               This Visit's Progress     Patient Stated (pt-stated)        Patient stated she would like to continue to maintain her current lifestyle.       Personalized Health Maintenance & Screening Recommendations  Urine ACR Eye exam Shingles vaccine  Lung Cancer Screening Recommended: no (Low Dose CT Chest recommended if Age 93-80 years, 30 pack-year currently smoking OR have quit w/in past 15 years) Hepatitis C Screening recommended: no HIV Screening recommended: no  Advanced Directives: Written information was not prepared per patient's request.  Referrals & Orders No orders of the defined  types were placed in this encounter.   Follow-up Plan Follow-up with Agapito Games, MD as planned Schedule shingles vaccine at the pharmacy.  Urine ACR can be completed at the next visit.  Please schedule your eye exam. Medicare wellness visit in one year.  AVS printed and mailed to the patient.   I have personally reviewed and noted the following in the patient's chart:   Medical and social history Use of alcohol, tobacco or illicit drugs  Current medications and supplements Functional ability and status Nutritional status Physical activity Advanced directives List of other physicians Hospitalizations, surgeries, and ER visits in previous 12 months Vitals Screenings to include cognitive, depression, and falls Referrals and appointments  In addition, I have reviewed and discussed with Pollyann Glen certain preventive protocols, quality metrics, and best practice recommendations. A written personalized care plan for preventive services as well as general preventive health recommendations is available and can be mailed to the patient at her request.      Modesto Charon, RN BSN  09/29/2022

## 2022-10-01 NOTE — Progress Notes (Signed)
Yes ok to wait on increase until recheck.

## 2022-11-09 ENCOUNTER — Other Ambulatory Visit: Payer: Self-pay | Admitting: Family Medicine

## 2022-11-17 ENCOUNTER — Ambulatory Visit: Payer: PRIVATE HEALTH INSURANCE | Admitting: Family Medicine

## 2022-11-17 ENCOUNTER — Telehealth: Payer: Self-pay | Admitting: Family Medicine

## 2022-11-17 NOTE — Telephone Encounter (Signed)
Daughter called. Patient missed appointment because of a family emergency.

## 2022-11-22 ENCOUNTER — Ambulatory Visit: Payer: PRIVATE HEALTH INSURANCE | Admitting: Family Medicine

## 2023-01-03 ENCOUNTER — Encounter: Payer: Self-pay | Admitting: Family Medicine

## 2023-01-03 ENCOUNTER — Ambulatory Visit (INDEPENDENT_AMBULATORY_CARE_PROVIDER_SITE_OTHER): Payer: Medicare Other | Admitting: Family Medicine

## 2023-01-03 VITALS — BP 151/72 | HR 66 | Ht 62.0 in | Wt 172.0 lb

## 2023-01-03 DIAGNOSIS — E119 Type 2 diabetes mellitus without complications: Secondary | ICD-10-CM | POA: Diagnosis not present

## 2023-01-03 DIAGNOSIS — I10 Essential (primary) hypertension: Secondary | ICD-10-CM

## 2023-01-03 DIAGNOSIS — F419 Anxiety disorder, unspecified: Secondary | ICD-10-CM

## 2023-01-03 DIAGNOSIS — G3184 Mild cognitive impairment, so stated: Secondary | ICD-10-CM

## 2023-01-03 DIAGNOSIS — F32A Depression, unspecified: Secondary | ICD-10-CM

## 2023-01-03 DIAGNOSIS — R7303 Prediabetes: Secondary | ICD-10-CM

## 2023-01-03 LAB — POCT GLYCOSYLATED HEMOGLOBIN (HGB A1C): Hemoglobin A1C: 8.2 % — AB (ref 4.0–5.6)

## 2023-01-03 LAB — POCT UA - MICROALBUMIN
Albumin/Creatinine Ratio, Urine, POC: <30
Creatinine, POC: 200 mg/dL
Microalbumin Ur, POC: 30 mg/L

## 2023-01-03 MED ORDER — MEMANTINE HCL 5 MG PO TABS
ORAL_TABLET | ORAL | 1 refills | Status: DC
Start: 2023-01-03 — End: 2023-04-06

## 2023-01-03 MED ORDER — ESCITALOPRAM OXALATE 5 MG PO TABS: 5.0000 mg | ORAL_TABLET | Freq: Every day | ORAL | 0 refills | Status: DC

## 2023-01-03 MED ORDER — BUPROPION HCL ER (XL) 150 MG PO TB24: 150.0000 mg | ORAL_TABLET | Freq: Every day | ORAL | 0 refills | Status: DC

## 2023-01-03 MED ORDER — FLUOXETINE HCL 20 MG PO TABS: ORAL_TABLET | ORAL | 1 refills | Status: DC

## 2023-01-03 NOTE — Assessment & Plan Note (Signed)
Uncontrolled A1c today.  Daughter says that she can eat up to half ago of ice cream every 1 to 2 days.  Encouraged her to work on cutting back on that significantly and cutting back on artificial sweeteners.  Plan to follow back up in 6 weeks to see if she is improving we will probably just do an A1c off the books at that point.  Continue to work on increasing vegetable intake  Lab Results  Component Value Date   HGBA1C 8.2 (A) 01/03/2023

## 2023-01-03 NOTE — Assessment & Plan Note (Signed)
He is on the highest dose of rivastigmine.  Will add memantine.   Memantine in combination with rivastigmine appears to be safe and beneficial in patients with mild-to-moderate AD per NIH.

## 2023-01-03 NOTE — Progress Notes (Signed)
Established Patient Office Visit  Subjective   Patient ID: Kimberly Hines, female    DOB: 08/06/1940  Age: 82 y.o. MRN: [id number]  Chief Complaint  Patient presents with   Hypertension   ifg    HPI  Hypertension- Pt denies chest pain, SOB, dizziness, or heart palpitations.  Taking meds as directed w/o problems.  Denies medication side effects.    Diabetes - no hypoglycemic events. No wounds or sores that are not healing well. No increased thirst or urination. Checking glucose at home. Taking medications as prescribed without any side effects.  Daughter who is here with her today does feel like there is been an increased decline in her memory.  She is currently on Exelon 6 mg.  Follow-up mood-she says she is just been more sad and down lately she is just felt lonely that she does have a women's meeting once a month that she does look forward to.  Her daughter reports that she is more irritable.    ROS    Objective:     BP (!) 151/72 (BP Location: Left Arm, Cuff Size: Large)   Pulse 66   Ht 5\' 2"  (1.575 m)   Wt 172 lb (78 kg)   SpO2 98%   BMI 31.46 kg/m    Physical Exam Vitals and nursing note reviewed.  Constitutional:      Appearance: She is well-developed.  HENT:     Head: Normocephalic and atraumatic.  Cardiovascular:     Rate and Rhythm: Normal rate and regular rhythm.     Heart sounds: Normal heart sounds.  Pulmonary:     Effort: Pulmonary effort is normal.     Breath sounds: Normal breath sounds.  Skin:    General: Skin is warm and dry.  Neurological:     Mental Status: She is alert and oriented to person, place, and time.  Psychiatric:        Behavior: Behavior normal.      Results for orders placed or performed in visit on 01/03/23  POCT glycosylated hemoglobin (Hb A1C)  Result Value Ref Range   Hemoglobin A1C 8.2 (A) 4.0 - 5.6 %   HbA1c POC (<> result, manual entry)     HbA1c, POC (prediabetic range)     HbA1c, POC (controlled diabetic  range)    POCT UA - Microalbumin  Result Value Ref Range   Microalbumin Ur, POC 30 mg/L   Creatinine, POC 200 mg/dL   Albumin/Creatinine Ratio, Urine, POC <30       The ASCVD Risk score (Arnett DK, et al., 2019) failed to calculate for the following reasons:   The 2019 ASCVD risk score is only valid for ages 28 to 84    Assessment & Plan:   Problem List Items Addressed This Visit       Cardiovascular and Mediastinum   Hypertension - Primary    Pressures quite elevated today.  It looked great 4 months ago.  She did not take her medications this morning before she left the house.  She usually takes them with her morning coffee and did not have time to make her coffee before she left.  So just encouraged her to make sure she is taking it consistently.        Endocrine   Type 2 diabetes mellitus without complication, without long-term current use of insulin (HCC)    Uncontrolled A1c today.  Daughter says that she can eat up to half ago of ice cream every  1 to 2 days.  Encouraged her to work on cutting back on that significantly and cutting back on artificial sweeteners.  Plan to follow back up in 6 weeks to see if she is improving we will probably just do an A1c off the books at that point.  Continue to work on increasing vegetable intake  Lab Results  Component Value Date   HGBA1C 8.2 (A) 01/03/2023         Relevant Orders   POCT UA - Microalbumin (Completed)     Other   RESOLVED: Prediabetes    Wil change dx to Diabetes.  Uncontrolled. Discussed cutting back on ice cream.  Her daughter says she can eat a half a gallon in 1 to 2 days and also cutting back on artificial sweeteners.  Eating more vegetables.  Will recheck again in 6 weeks to see if she is making some changes.  Lab Results  Component Value Date   HGBA1C 8.2 (A) 01/03/2023         Relevant Orders   POCT glycosylated hemoglobin (Hb A1C) (Completed)   Mild cognitive impairment of uncertain or unknown  etiology    He is on the highest Hines of rivastigmine.  Will add memantine.   Memantine in combination with rivastigmine appears to be safe and beneficial in patients with mild-to-moderate AD per NIH.       Relevant Medications   memantine (NAMENDA) 5 MG tablet   Anxiety and depression    Been struggling more with apathy and low energy.  Will taper off of the Lexapro and switch to fluoxetine.  Follow-up in 6 weeks. Cut the Lexapro in half and take 10mg  daily for 10 days, then 5 mg daily for 8 days and then stop and start the new medication the next day.   Start fluoxetine 1/2 tab daily for 6 days and the whole tab daily.       Relevant Medications   escitalopram (LEXAPRO) 5 MG tablet   FLUoxetine (PROZAC) 20 MG tablet   buPROPion (WELLBUTRIN XL) 150 MG 24 hr tablet   He also has a scaly hyperkeratotic lesion on her right temple that she would like me to look at today is most consistent with a wartlike seborrheic keratosis.  Recommend cryotherapy when she is back from the beach.  Return in about 6 weeks (around 02/14/2023) for Mood.    Nani Gasser, MD

## 2023-01-03 NOTE — Assessment & Plan Note (Signed)
Been struggling more with apathy and low energy.  Will taper off of the Lexapro and switch to fluoxetine.  Follow-up in 6 weeks. Cut the Lexapro in half and take 10mg  daily for 10 days, then 5 mg daily for 8 days and then stop and start the new medication the next day.   Start fluoxetine 1/2 tab daily for 6 days and the whole tab daily.

## 2023-01-03 NOTE — Patient Instructions (Addendum)
Follow up in 2 weeks for BP check.  Cut the Lexapro in half and take 10mg  daily for 10 days, then 5 mg daily for 8 days and then stop and start the new medication the next day.   Start fluoxetine 1/2 tab daily for 6 days and the whole tab daily.

## 2023-01-03 NOTE — Assessment & Plan Note (Signed)
Wil change dx to Diabetes.  Uncontrolled. Discussed cutting back on ice cream.  Her daughter says she can eat a half a gallon in 1 to 2 days and also cutting back on artificial sweeteners.  Eating more vegetables.  Will recheck again in 6 weeks to see if she is making some changes.  Lab Results  Component Value Date   HGBA1C 8.2 (A) 01/03/2023

## 2023-01-03 NOTE — Assessment & Plan Note (Signed)
Pressures quite elevated today.  It looked great 4 months ago.  She did not take her medications this morning before she left the house.  She usually takes them with her morning coffee and did not have time to make her coffee before she left.  So just encouraged her to make sure she is taking it consistently.

## 2023-01-17 ENCOUNTER — Ambulatory Visit: Payer: Medicare Other

## 2023-01-31 ENCOUNTER — Ambulatory Visit: Payer: Medicare Other | Admitting: Family Medicine

## 2023-02-14 ENCOUNTER — Ambulatory Visit: Payer: Medicare Other | Admitting: Family Medicine

## 2023-02-18 ENCOUNTER — Other Ambulatory Visit: Payer: Self-pay | Admitting: Family Medicine

## 2023-03-01 ENCOUNTER — Ambulatory Visit (INDEPENDENT_AMBULATORY_CARE_PROVIDER_SITE_OTHER): Payer: Medicare Other | Admitting: Family Medicine

## 2023-03-01 ENCOUNTER — Encounter: Payer: Self-pay | Admitting: Family Medicine

## 2023-03-01 VITALS — BP 144/66 | HR 80 | Ht 62.0 in | Wt 166.0 lb

## 2023-03-01 DIAGNOSIS — R7989 Other specified abnormal findings of blood chemistry: Secondary | ICD-10-CM

## 2023-03-01 DIAGNOSIS — E876 Hypokalemia: Secondary | ICD-10-CM

## 2023-03-01 DIAGNOSIS — G3184 Mild cognitive impairment, so stated: Secondary | ICD-10-CM | POA: Diagnosis not present

## 2023-03-01 DIAGNOSIS — Z6830 Body mass index (BMI) 30.0-30.9, adult: Secondary | ICD-10-CM

## 2023-03-01 DIAGNOSIS — E119 Type 2 diabetes mellitus without complications: Secondary | ICD-10-CM

## 2023-03-01 MED ORDER — EMPAGLIFLOZIN 10 MG PO TABS
10.0000 mg | ORAL_TABLET | Freq: Every day | ORAL | 3 refills | Status: DC
Start: 2023-03-01 — End: 2023-07-17

## 2023-03-01 MED ORDER — FLUOXETINE HCL 40 MG PO CAPS: 40.0000 mg | ORAL_CAPSULE | Freq: Every day | ORAL | 1 refills | Status: DC

## 2023-03-01 NOTE — Progress Notes (Signed)
Established Patient Office Visit  Subjective   Patient ID: Kimberly Hines, female    DOB: 04-01-1941  Age: 82 y.o. MRN: [id number]  Chief Complaint  Patient presents with   hyperkeratotic lesion    R temple    Diabetes    A1c=8.9%    HPI  She is here today to have a few seborrheic keratoses on her face treated.  She has never had cryotherapy performed.  She wanted to go ahead and recheck her A1c today.  Her daughter reports that she is still eating a lot of sugary foods and eats them in large amounts sometimes.  She is still driving usually not more than about 15 minutes from her home but has gotten lost a couple of times recently.  She is still really grieving for the loss of her husband.     ROS    Objective:     BP (!) 144/66   Pulse 80   Ht 5\' 2"  (1.575 m)   Wt 166 lb (75.3 kg)   SpO2 97%   BMI 30.36 kg/m     Physical Exam Vitals and nursing note reviewed.  Constitutional:      Appearance: Normal appearance.  HENT:     Head: Normocephalic and atraumatic.  Eyes:     Conjunctiva/sclera: Conjunctivae normal.  Cardiovascular:     Rate and Rhythm: Normal rate and regular rhythm.  Pulmonary:     Effort: Pulmonary effort is normal.     Breath sounds: Normal breath sounds.  Skin:    General: Skin is warm and dry.  Neurological:     Mental Status: She is alert.  Psychiatric:        Mood and Affect: Mood normal.      No results found for any visits on 03/01/23.     The ASCVD Risk score (Arnett DK, et al., 2019) failed to calculate for the following reasons:   The 2019 ASCVD risk score is only valid for ages 55 to 20    Assessment & Plan:   Problem List Items Addressed This Visit       Endocrine   Type 2 diabetes mellitus without complication, without long-term current use of insulin (HCC)    Fortunately her A1c went up even higher so we will go ahead and initiate medication.  Also discussed the importance of cutting out sugary foods and  sweets.  She is also been drinking a lot of diet sodas lately so encouraged her to cut back on those as well.  Lab Results  Component Value Date   HGBA1C 8.2 (A) 01/03/2023         Relevant Medications   empagliflozin (JARDIANCE) 10 MG TABS tablet   Other Relevant Orders   CMP14+EGFR   TSH   VITAMIN D 25 Hydroxy (Vit-D Deficiency, Fractures)     Other   Mild cognitive impairment of uncertain or unknown etiology - Primary    Memantine in combination with rivastigmine appears to be safe and beneficial in patients with mild-to-moderate AD per NIH.   MoCA score of 16 out of 30 today.  She is also having episodes of tearful outbursts which I think are may be more related to the mention that she is grieving the loss of her husband as well.  Would like to go ahead and refer her to neurology just to make sure that we are on the right track and get their consultation.  I did discuss with her not driving anymore since  she is already had a couple episodes where she has gotten lost while driving.  Also wrote this down for her.  She did become upset at the end of the appointment.      Relevant Orders   CMP14+EGFR   TSH   VITAMIN D 25 Hydroxy (Vit-D Deficiency, Fractures)   Ambulatory referral to Neurology   Hypokalemia   Relevant Orders   CMP14+EGFR   TSH   VITAMIN D 25 Hydroxy (Vit-D Deficiency, Fractures)   Other Visit Diagnoses     Low vitamin D level       Relevant Orders   CMP14+EGFR   TSH   VITAMIN D 25 Hydroxy (Vit-D Deficiency, Fractures)   BMI 30.0-30.9,adult       Relevant Orders   CMP14+EGFR   TSH   VITAMIN D 25 Hydroxy (Vit-D Deficiency, Fractures)      Cryotherapy Procedure Note  Pre-operative Diagnosis: Seborrheic keratosis  Post-operative Diagnosis: same   Locations: face x 4, 2 on right temple, 1 on forehead and 1 on left lower cheek  Indications: irritation  Anesthesia: not needed   Procedure Details  Patient informed of risks (permanent scarring,  infection, light or dark discoloration, bleeding, infection, weakness, numbness and recurrence of the lesion) and benefits of the procedure and verbal informed consent obtained.  The areas are treated with liquid nitrogen therapy, frozen until ice ball extended 1-2 mm beyond lesion, allowed to thaw, and treated again. The patient tolerated procedure well.  The patient was instructed on post-op care, warned that there may be blister formation, redness and pain. Recommend OTC analgesia as needed for pain.  Condition: Stable  Complications: none.  Plan: Wash area with normal soap and water. When skip peels use Vaseline or Aquaphor.     Recommended that the patient use OTC acetaminophen as needed for pain.  Return PRN.    Return in about 3 months (around 06/01/2023) for Diabetes follow-up.   I spent 40 minutes on the day of the encounter to include pre-visit record review, face-to-face time with the patient and post visit ordering of test.   Nani Gasser, MD

## 2023-03-01 NOTE — Patient Instructions (Signed)
You cannot drive anymore until we get you in with the Neurologist.

## 2023-03-01 NOTE — Assessment & Plan Note (Signed)
Fortunately her A1c went up even higher so we will go ahead and initiate medication.  Also discussed the importance of cutting out sugary foods and sweets.  She is also been drinking a lot of diet sodas lately so encouraged her to cut back on those as well.  Lab Results  Component Value Date   HGBA1C 8.2 (A) 01/03/2023

## 2023-03-01 NOTE — Assessment & Plan Note (Addendum)
Memantine in combination with rivastigmine appears to be safe and beneficial in patients with mild-to-moderate AD per NIH.   MoCA score of 16 out of 30 today.  She is also having episodes of tearful outbursts which I think are may be more related to the mention that she is grieving the loss of her husband as well.  Would like to go ahead and refer her to neurology just to make sure that we are on the right track and get their consultation.  I did discuss with her not driving anymore since she is already had a couple episodes where she has gotten lost while driving.  Also wrote this down for her.  She did become upset at the end of the appointment.

## 2023-03-02 LAB — CMP14+EGFR
ALT: 15 [IU]/L (ref 0–32)
AST: 15 [IU]/L (ref 0–40)
Albumin: 4.3 g/dL (ref 3.8–4.8)
Alkaline Phosphatase: 136 [IU]/L — ABNORMAL HIGH (ref 44–121)
BUN/Creatinine Ratio: 15 (ref 12–28)
BUN: 15 mg/dL (ref 8–27)
Bilirubin Total: 0.5 mg/dL (ref 0.0–1.2)
CO2: 20 mmol/L (ref 20–29)
Calcium: 10.7 mg/dL — ABNORMAL HIGH (ref 8.7–10.3)
Chloride: 96 mmol/L (ref 96–106)
Creatinine, Ser: 1.03 mg/dL — ABNORMAL HIGH (ref 0.57–1.00)
Globulin, Total: 2.8 g/dL (ref 1.5–4.5)
Glucose: 395 mg/dL — ABNORMAL HIGH (ref 70–99)
Potassium: 2.9 mmol/L — ABNORMAL LOW (ref 3.5–5.2)
Sodium: 136 mmol/L (ref 134–144)
Total Protein: 7.1 g/dL (ref 6.0–8.5)
eGFR: 55 mL/min/{1.73_m2} — ABNORMAL LOW (ref >59)

## 2023-03-02 LAB — TSH: TSH: 1.8 u[IU]/mL (ref 0.450–4.500)

## 2023-03-02 LAB — VITAMIN D 25 HYDROXY (VIT D DEFICIENCY, FRACTURES): Vit D, 25-Hydroxy: 37.8 ng/mL (ref 30.0–100.0)

## 2023-03-02 NOTE — Progress Notes (Signed)
Call Kimberly Hines's daughter Kimberly Hines and let her know that Kimberly Hines's potassium is actually really low.  If she still taking her potassium regularly?  Is she taking 2 a day?  If she is then we may need to look at discontinuing the fluoxetine.  In rare instances it can drop potassium and can worsen it.  Just let me know.

## 2023-03-03 NOTE — Progress Notes (Signed)
Please increase to twice a day and then recheck in 1 week.  If not improving then we can look at the medicine.

## 2023-03-14 ENCOUNTER — Other Ambulatory Visit: Payer: Self-pay | Admitting: Family Medicine

## 2023-03-14 DIAGNOSIS — G3184 Mild cognitive impairment, so stated: Secondary | ICD-10-CM

## 2023-04-05 ENCOUNTER — Telehealth: Payer: Self-pay | Admitting: Family Medicine

## 2023-04-05 NOTE — Telephone Encounter (Signed)
Prescription Request  04/05/2023  LOV: 03/01/2023 . Patient is out of both medications  What is the name of the medication or equipment? memantine (NAMENDA) 5 MG tablet and amLODipine (NORVASC) 10 MG tablet   Have you contacted your pharmacy to request a refill? Yes   Which pharmacy would you like this sent to?   [facility] - [place], [place] - [address] [place] [address] [place] [address] Phone: [phone] Fax: [phone]    Patient notified that their request is being sent to the clinical staff for review and that they should receive a response within 2 business days.   Please advise at Mobile [phone] (mobile)

## 2023-04-06 ENCOUNTER — Other Ambulatory Visit: Payer: Self-pay | Admitting: Family Medicine

## 2023-04-06 DIAGNOSIS — G3184 Mild cognitive impairment, so stated: Secondary | ICD-10-CM

## 2023-04-06 MED ORDER — AMLODIPINE BESYLATE 10 MG PO TABS: 10.0000 mg | ORAL_TABLET | Freq: Every day | ORAL | 1 refills | Status: DC

## 2023-04-06 NOTE — Telephone Encounter (Signed)
Pharmacy called today. Patient is now out of both meds. PLEASE UPDATE PHARMACY ON FILE. HER NEW PHARMACY IS [facility].

## 2023-04-06 NOTE — Telephone Encounter (Signed)
Medications sent

## 2023-04-20 ENCOUNTER — Other Ambulatory Visit: Payer: Self-pay | Admitting: Family Medicine

## 2023-05-04 ENCOUNTER — Ambulatory Visit: Payer: Medicare Other | Admitting: Family Medicine

## 2023-05-04 NOTE — Progress Notes (Unsigned)
   Acute Office Visit  Subjective:     Patient ID: Kimberly Hines, female    DOB: 11-Nov-1940, 82 y.o.   MRN: [id number]  No chief complaint on file.   HPI Patient is in today for concerns of bronchitis.   ROS      Objective:    There were no vitals taken for this visit. {Vitals History (Optional):23777}  Physical Exam  No results found for any visits on 05/04/23.      Assessment & Plan:   Problem List Items Addressed This Visit   None   No orders of the defined types were placed in this encounter.   No follow-ups on file.  Charlton Amor, DO

## 2023-06-01 ENCOUNTER — Ambulatory Visit: Payer: Medicare Other | Admitting: Family Medicine

## 2023-06-18 ENCOUNTER — Other Ambulatory Visit: Payer: Self-pay | Admitting: Family Medicine

## 2023-06-27 ENCOUNTER — Other Ambulatory Visit: Payer: Self-pay | Admitting: Family Medicine

## 2023-06-27 DIAGNOSIS — G3184 Mild cognitive impairment, so stated: Secondary | ICD-10-CM

## 2023-07-17 ENCOUNTER — Other Ambulatory Visit: Payer: Self-pay | Admitting: Family Medicine

## 2023-07-17 DIAGNOSIS — E119 Type 2 diabetes mellitus without complications: Secondary | ICD-10-CM

## 2023-07-20 ENCOUNTER — Other Ambulatory Visit: Payer: Self-pay | Admitting: Family Medicine

## 2023-08-01 ENCOUNTER — Other Ambulatory Visit: Payer: Self-pay | Admitting: Family Medicine

## 2023-08-10 ENCOUNTER — Other Ambulatory Visit: Payer: Self-pay | Admitting: Family Medicine

## 2023-08-10 DIAGNOSIS — G3184 Mild cognitive impairment, so stated: Secondary | ICD-10-CM

## 2023-08-18 ENCOUNTER — Other Ambulatory Visit: Payer: Self-pay | Admitting: Family Medicine

## 2023-08-18 DIAGNOSIS — G3184 Mild cognitive impairment, so stated: Secondary | ICD-10-CM

## 2023-08-23 ENCOUNTER — Ambulatory Visit: Payer: PRIVATE HEALTH INSURANCE | Admitting: Family Medicine

## 2023-08-29 ENCOUNTER — Ambulatory Visit: Payer: PRIVATE HEALTH INSURANCE | Admitting: Physician Assistant

## 2023-09-26 ENCOUNTER — Other Ambulatory Visit: Payer: Self-pay | Admitting: Family Medicine

## 2023-09-26 DIAGNOSIS — G3184 Mild cognitive impairment, so stated: Secondary | ICD-10-CM

## 2023-10-07 ENCOUNTER — Telehealth: Payer: Self-pay | Admitting: Family Medicine

## 2023-10-07 NOTE — Telephone Encounter (Signed)
 Please call patient and see if she is having any issues getting her medication we got notification from her insurance company that she is not feeling her diabetes medicines consistently.  I see she has an appointment in June.  Can we see if we might be able to get that moved up as she is due for multiple diabetes metrics.  If not, then we can keep the June appointment.

## 2023-10-10 NOTE — Telephone Encounter (Signed)
 Attempted call to patient daugher (lisa) left a voice mail message requesting a return call.

## 2023-10-11 NOTE — Telephone Encounter (Signed)
 I understand.  We can just keep the June appointment especially since her daughter has arranged her schedule to come then.  We can talk about some strategies when she comes back in.

## 2023-10-11 NOTE — Telephone Encounter (Signed)
 Spoke with patient's daughter Edwina Gram- she states that the problem with the medication is her mother is just not wanting to take it. She  states she found 3 days worth of medication that she had stuffed in a cabinet. She is working on getting someone to stay with her during the day to make sure that she is taking her medications  as well.  She could not move up the June appt to any available slots . She states she has worked out her schedule for June  but if Dr. Greer Leak thought was necessary to see her sooner she would try to work this out.

## 2023-10-11 NOTE — Telephone Encounter (Signed)
 Again attempted call to patient's daughter Edwina Gram- left a voice mail message requesting a return call.

## 2023-10-13 ENCOUNTER — Other Ambulatory Visit: Payer: Self-pay | Admitting: Family Medicine

## 2023-10-13 ENCOUNTER — Encounter

## 2023-10-13 NOTE — Telephone Encounter (Signed)
 Patient informed by front desk staff of message by Dr. Greer Leak.

## 2023-10-13 NOTE — Telephone Encounter (Signed)
 Copied from CRM [id number]. Topic: Clinical - Medication Refill >> Oct 13, 2023  8:46 AM Carrielelia G wrote: Medication:  FLUoxetine  (PROZAC ) 40 MG capsule clopidogrel  (PLAVIX ) 75 MG tablet  Has the patient contacted their pharmacy? Yes (Agent: If no, request that the patient contact the pharmacy for the refill. If patient does not wish to contact the pharmacy document the reason why and proceed with request.) (Agent: If yes, when and what did the pharmacy advise?)  This is the patient's preferred pharmacy:  [facility] [place], [place] - [address] [place] [address] [place] [address] Phone: [phone] Fax: [phone]   Is this the correct pharmacy for this prescription? Yes If no, delete pharmacy and type the correct one.    Is the patient out of the medication? Yes  Has the patient been seen for an appointment in the last year OR does the patient have an upcoming appointment? Yes  Can we respond through MyChart? No  Agent: Please be advised that Rx refills may take up to 3 business days. We ask that you follow-up with your pharmacy.

## 2023-10-14 MED ORDER — FLUOXETINE HCL 40 MG PO CAPS: 40.0000 mg | ORAL_CAPSULE | Freq: Every day | ORAL | 0 refills | Status: DC

## 2023-10-14 MED ORDER — CLOPIDOGREL BISULFATE 75 MG PO TABS: 75.0000 mg | ORAL_TABLET | Freq: Every day | ORAL | 0 refills | Status: DC

## 2023-10-29 ENCOUNTER — Other Ambulatory Visit: Payer: Self-pay | Admitting: Family Medicine

## 2023-11-10 ENCOUNTER — Ambulatory Visit: Admitting: Family Medicine

## 2023-11-10 ENCOUNTER — Encounter: Payer: Self-pay | Admitting: Family Medicine

## 2023-11-10 NOTE — Progress Notes (Deleted)
   Established Patient Office Visit  Subjective  Patient ID: Kimberly Hines, female    DOB: [birth year]  Age: 83 y.o. MRN: [id number]  No chief complaint on file.   HPI  Hypertension- Pt denies chest pain, SOB, dizziness, or heart palpitations.  Taking meds as directed w/o problems.  Denies medication side effects.    Diabetes - no hypoglycemic events. No wounds or sores that are not healing well. No increased thirst or urination. Checking glucose at home. Taking medications as prescribed without any side effects.   {History (Optional):23778}  ROS    Objective:     There were no vitals taken for this visit. {Vitals History (Optional):23777}  Physical Exam   No results found for any visits on 11/10/23.  {Labs (Optional):23779}  The ASCVD Risk score (Arnett DK, et al., 2019) failed to calculate for the following reasons:   The 2019 ASCVD risk score is only valid for ages 66 to 86    Assessment & Plan:   Problem List Items Addressed This Visit       Cardiovascular and Mediastinum   Hypertension - Primary     Endocrine   Type 2 diabetes mellitus without complication, without long-term current use of insulin (HCC)    No follow-ups on file.    Duaine German, MD

## 2023-11-18 ENCOUNTER — Other Ambulatory Visit: Payer: Self-pay | Admitting: Family Medicine

## 2023-11-21 ENCOUNTER — Telehealth: Payer: Self-pay

## 2023-11-21 NOTE — Telephone Encounter (Signed)
 Copied from CRM [id number]. Topic: Clinical - Medication Question >> Nov 21, 2023  3:56 PM Miquel SAILOR wrote: Reason for CRM: Patient daughter [name] due to PT has dementia calling to let office know to look out for fax from Pharmacy to refill medication. She doe snot remember the medications. Pls look out for fax. I let her know it would be better to call back and give all medications. Also PT has ap for 12/26/23. Needs call [facility] with update [phone]

## 2023-11-24 ENCOUNTER — Encounter

## 2023-11-28 ENCOUNTER — Telehealth: Payer: Self-pay

## 2023-11-28 NOTE — Telephone Encounter (Signed)
 Copied from CRM [id number]. Topic: Appointments - Scheduling Inquiry for Clinic >> Nov 24, 2023  4:41 PM Graeme ORN wrote: Reason for CRM: Patient daughter called. States she missed phone appt with Jon. She had an emergency and is just getting out. Would like her to call her when she is available. Thank You

## 2023-11-28 NOTE — Telephone Encounter (Signed)
 Patient rescheduled appointment.

## 2023-12-03 ENCOUNTER — Other Ambulatory Visit: Payer: Self-pay | Admitting: Family Medicine

## 2023-12-03 DIAGNOSIS — G3184 Mild cognitive impairment, so stated: Secondary | ICD-10-CM

## 2023-12-06 NOTE — Telephone Encounter (Signed)
 Patient scheduled on 12/26/23, thanks.

## 2023-12-06 NOTE — Telephone Encounter (Signed)
 Pls contact pt to schedule DM appt with Dr. Alvan. Past due since March 2025. Thx

## 2023-12-07 ENCOUNTER — Ambulatory Visit

## 2023-12-07 VITALS — Ht 62.0 in | Wt 160.0 lb

## 2023-12-07 DIAGNOSIS — Z0000 Encounter for general adult medical examination without abnormal findings: Secondary | ICD-10-CM

## 2023-12-07 NOTE — Patient Instructions (Signed)
  Kimberly Hines , Thank you for taking time to come for your Medicare Wellness Visit. I appreciate your ongoing commitment to your health goals. Please review the following plan we discussed and let me know if I can assist you in the future.   These are the goals we discussed:  Goals       Patient Stated (pt-stated)      Patient stated she would like to continue to maintain her current lifestyle.      Patient Stated      Patient states she would like to try and drink more water.         This is a list of the screening recommended for you and due dates:  Health Maintenance  Topic Date Due   Eye exam for diabetics  Never done   Zoster (Shingles) Vaccine (1 of 2) Never done   COVID-19 Vaccine (1 - 2024-25 season) Never done   Hemoglobin A1C  07/06/2023   Complete foot exam   09/20/2023   Yearly kidney health urinalysis for diabetes  01/03/2024   Flu Shot  12/23/2023   Yearly kidney function blood test for diabetes  02/29/2024   Medicare Annual Wellness Visit  12/06/2024   DTaP/Tdap/Td vaccine (3 - Td or Tdap) 04/25/2031   Pneumococcal Vaccine for age over 19  Completed   DEXA scan (bone density measurement)  Completed   Hepatitis B Vaccine  Aged Out   HPV Vaccine  Aged Out   Meningitis B Vaccine  Aged Out

## 2023-12-07 NOTE — Progress Notes (Signed)
 Subjective:   Kimberly Hines is a 83 y.o. female who presents for Medicare Annual (Subsequent) preventive examination.  Visit Complete: Virtual I connected with  Kimberly Hines on 12/07/23 by a audio enabled telemedicine application and verified that I am speaking with the correct person using two identifiers.  Patient Location: Home  Provider Location: Office/Clinic  I discussed the limitations of evaluation and management by telemedicine. The patient expressed understanding and agreed to proceed.  Vital Signs: Because this visit was a virtual/telehealth visit, some criteria may be missing or patient reported. Any vitals not documented were not able to be obtained and vitals that have been documented are patient reported.  Patient Medicare AWV questionnaire was completed by the patient on n/a; I have confirmed that all information answered by patient is correct and no changes since this date.  Cardiac Risk Factors include: advanced age (>88men, >53 women);hypertension;diabetes mellitus;dyslipidemia;family history of premature cardiovascular disease     Objective:    Today's Vitals   12/07/23 0757  Weight: 160 lb (72.6 kg)  Height: 5' 2 (1.575 m)   Body mass index is 29.26 kg/m.     12/07/2023    8:11 AM 09/29/2022    9:36 AM 01/26/2022    5:00 PM 04/24/2021   10:17 AM  Advanced Directives  Does Patient Have a Medical Advance Directive? Yes Yes No No  Type of Advance Directive Healthcare Power of Attorney Living will    Does patient want to make changes to medical advance directive? No - Patient declined No - Patient declined    Copy of Healthcare Power of Attorney in Chart? No - copy requested     Would patient like information on creating a medical advance directive?    No - Patient declined    Current Medications (verified) Outpatient Encounter Medications as of 12/07/2023  Medication Sig   amLODipine  (NORVASC ) 10 MG tablet Take 1 tablet (10 mg total) by mouth daily.    aspirin EC 81 MG tablet Take 81 mg by mouth daily. Swallow whole.   atorvastatin  (LIPITOR) 80 MG tablet TAKE 1 TABLET BY MOUTH DAILY   buPROPion  (WELLBUTRIN  XL) 150 MG 24 hr tablet Take 1 tablet (150 mg total) by mouth daily.   calcium  carbonate (OS-CAL - DOSED IN MG OF ELEMENTAL CALCIUM ) 1250 (500 Ca) MG tablet Take 600 mg by mouth.   Cholecalciferol (VITAMIN D3) 50 MCG (2000 UT) TABS Take 1 tablet by mouth daily.   clopidogrel  (PLAVIX ) 75 MG tablet Take 1 tablet (75 mg total) by mouth daily.   FLUoxetine  (PROZAC ) 40 MG capsule Take 1 capsule (40 mg total) by mouth daily.   JARDIANCE  10 MG TABS tablet Take 1 tablet (10 mg total) by mouth daily before breakfast.   memantine  (NAMENDA ) 5 MG tablet TAKE ONE TABLET (FIVE MG TOTAL) BY MOUTH TWICE DAILY   potassium bicarbonate  (K-LYTE) 25 MEQ disintegrating tablet Take 2 tablets (50 mEq total) by mouth daily.   rivastigmine  (EXELON ) 6 MG capsule Take 1 capsule (6 mg total) by mouth in the morning and at bedtime.   No facility-administered encounter medications on file as of 12/07/2023.    Allergies (verified) Benicar [olmesartan], Diazepam, Latex, and Tape   History: Past Medical History:  Diagnosis Date   Anxiety    Hyperlipidemia    Hypertension    Mild cognitive impairment    Type 2 diabetes mellitus without complication, without long-term current use of insulin (HCC) 05/26/2017   Past Surgical History:  Procedure  Laterality Date   ABDOMINAL HYSTERECTOMY     BACK SURGERY     3 times   CHOLECYSTECTOMY     Family History  Problem Relation Age of Onset   Alzheimer's disease Mother    Cancer Father        Education officer, environmental   Coronary artery disease Paternal Aunt    Diabetes Other    Social History   Socioeconomic History   Marital status: Widowed    Spouse name: Not on file   Number of children: 2   Years of education: 6   Highest education level: 12th grade  Occupational History   Occupation: Retired  Tobacco Use   Smoking  status: Never   Smokeless tobacco: Never  Vaping Use   Vaping status: Never Used  Substance and Sexual Activity   Alcohol use: Never   Drug use: Never   Sexual activity: Yes    Partners: Male    Birth control/protection: Other-see comments    Comment: hysterectomy  Other Topics Concern   Not on file  Social History Narrative   Lives alone. Her husband recently passed away. She sees her daughter often. She enjoys doing yard work and staying active.   Social Drivers of Corporate investment banker Strain: Low Risk  (12/07/2023)   Overall Financial Resource Strain (CARDIA)    Difficulty of Paying Living Expenses: Not hard at all  Food Insecurity: No Food Insecurity (12/07/2023)   Hunger Vital Sign    Worried About Running Out of Food in the Last Year: Never true    Ran Out of Food in the Last Year: Never true  Transportation Needs: No Transportation Needs (12/07/2023)   PRAPARE - Administrator, Civil Service (Medical): No    Lack of Transportation (Non-Medical): No  Physical Activity: Insufficiently Active (12/07/2023)   Exercise Vital Sign    Days of Exercise per Week: 1 day    Minutes of Exercise per Session: 30 min  Stress: No Stress Concern Present (12/07/2023)   [employer] of Occupational Health - Occupational Stress Questionnaire    Feeling of Stress: Not at all  Social Connections: Moderately Isolated (12/07/2023)   Social Connection and Isolation Panel    Frequency of Communication with Friends and Family: More than three times a week    Frequency of Social Gatherings with Friends and Family: More than three times a week    Attends Religious Services: More than 4 times per year    Active Member of [employer] or Organizations: No    Attends Banker Meetings: Never    Marital Status: Widowed    Tobacco Counseling Counseling given: Not Answered   Clinical Intake:  Pre-visit preparation completed: Yes  Pain : No/denies pain     BMI -  recorded: 29.26 Nutritional Status: BMI 25 -29 Overweight Nutritional Risks: None Diabetes: Yes CBG done?: No Did pt. bring in CBG monitor from home?: No  How often do you need to have someone help you when you read instructions, pamphlets, or other written materials from your doctor or pharmacy?: 1 - Never What is the last grade level you completed in school?: 12  Interpreter Needed?: No      Activities of Daily Living    12/07/2023    8:00 AM  In your present state of health, do you have any difficulty performing the following activities:  Hearing? 0  Vision? 0  Difficulty concentrating or making decisions? 1  Walking or climbing stairs?  0  Dressing or bathing? 0  Doing errands, shopping? 0  Preparing Food and eating ? N  Using the Toilet? N  In the past six months, have you accidently leaked urine? Y  Do you have problems with loss of bowel control? N  Managing your Medications? N  Managing your Finances? Y  Comment Daughter takes care of the finances  Housekeeping or managing your Housekeeping? Y    Patient Care Team: [name], MD as PCP - General (Family Medicine)  Indicate any recent Medical Services you may have received from other than [facility] providers in the past year (date may be approximate).     Assessment:   This is a routine wellness examination for Kimberly Hines.  Hearing/Vision screen No results found.   Goals Addressed             This Visit's Progress    Patient Stated       Patient states she would like to try and drink more water.        Depression Screen    12/07/2023    8:07 AM 03/01/2023    1:28 PM 01/03/2023    8:52 AM 09/29/2022    9:36 AM 09/20/2022   10:37 AM 03/09/2022   10:39 AM 01/26/2022    5:00 PM  PHQ 2/9 Scores  PHQ - 2 Score 6 6 6  0 2 0 6  PHQ- 9 Score 16 26 25  11  21     Fall Risk    12/07/2023    8:12 AM 03/01/2023    1:27 PM 09/29/2022    9:36 AM 09/20/2022    9:55 AM 07/19/2022    2:30 PM  Fall Risk   Falls  in the past year? 1 1 0 0 1  Number falls in past yr: 1 1 0 0 1  Injury with Fall? 0 1 0 0 0  Risk for fall due to : History of fall(s);Impaired mobility;Mental status change History of fall(s) No Fall Risks No Fall Risks History of fall(s)  Follow up Falls evaluation completed Falls evaluation completed Falls evaluation completed Falls evaluation completed Falls evaluation completed;Falls prevention discussed    MEDICARE RISK AT HOME: Medicare Risk at Home Any stairs in or around the home?: Yes If so, are there any without handrails?: Yes Home free of loose throw rugs in walkways, pet beds, electrical cords, etc?: Yes Adequate lighting in your home to reduce risk of falls?: Yes Life alert?: No Use of a cane, walker or w/c?: No Grab bars in the bathroom?: No Shower chair or bench in shower?: No Elevated toilet seat or a handicapped toilet?: No  TIMED UP AND GO:  Was the test performed?  No    Cognitive Function:      03/01/2023    2:11 PM  Montreal Cognitive Assessment   Visuospatial/ Executive (0/5) 2  Naming (0/3) 2  Attention: Read list of digits (0/2) 2  Attention: Read list of letters (0/1) 1  Attention: Serial 7 subtraction starting at 100 (0/3) 2  Language: Repeat phrase (0/2) 2  Language : Fluency (0/1) 0  Abstraction (0/2) 1  Delayed Recall (0/5) 0  Orientation (0/6) 3  Total 15  Adjusted Score (based on education) 16      12/07/2023    8:14 AM 09/29/2022    9:43 AM  6CIT Screen  What Year? 4 points 4 points  What month? 3 points 3 points  What time? 0 points 0 points  Count  back from 20 0 points 0 points  Months in reverse 2 points 2 points  Repeat phrase 10 points 10 points  Total Score 19 points 19 points    Immunizations Immunization History  Administered Date(s) Administered   Influenza, High Dose Seasonal PF 02/24/2015, 04/05/2018, 03/25/2020   Influenza-Unspecified 04/02/2004, 03/08/2006, 03/25/2007, 02/21/2009, 02/21/2010, 02/21/2014,  03/09/2016   Pneumococcal Conjugate-13 01/28/2015   Pneumococcal Polysaccharide-23 07/09/2005, 07/12/2005, 12/18/2013, 06/09/2021   Td (Adult),unspecified 11/28/2007   Tdap 12/02/2017, 04/24/2021    TDAP status: Up to date  Flu Vaccine status: Declined, Education has been provided regarding the importance of this vaccine but patient still declined. Advised may receive this vaccine at local pharmacy or Health Dept. Aware to provide a copy of the vaccination record if obtained from local pharmacy or Health Dept. Verbalized acceptance and understanding.  Pneumococcal vaccine status: Up to date  Covid-19 vaccine status: Declined, Education has been provided regarding the importance of this vaccine but patient still declined. Advised may receive this vaccine at local pharmacy or Health Dept.or vaccine clinic. Aware to provide a copy of the vaccination record if obtained from local pharmacy or Health Dept. Verbalized acceptance and understanding.  Qualifies for Shingles Vaccine? Yes   Zostavax completed No   Shingrix Completed?: No.    Education has been provided regarding the importance of this vaccine. Patient has been advised to call insurance company to determine out of pocket expense if they have not yet received this vaccine. Advised may also receive vaccine at local pharmacy or Health Dept. Verbalized acceptance and understanding.  Screening Tests Health Maintenance  Topic Date Due   OPHTHALMOLOGY EXAM  Never done   Zoster Vaccines- Shingrix (1 of 2) Never done   COVID-19 Vaccine (1 - 2024-25 season) Never done   HEMOGLOBIN A1C  07/06/2023   FOOT EXAM  09/20/2023   Diabetic kidney evaluation - Urine ACR  01/03/2024   INFLUENZA VACCINE  12/23/2023   Diabetic kidney evaluation - eGFR measurement  02/29/2024   Medicare Annual Wellness (AWV)  12/06/2024   DTaP/Tdap/Td (3 - Td or Tdap) 04/25/2031   Pneumococcal Vaccine: 50+ Years  Completed   DEXA SCAN  Completed   Hepatitis B Vaccines   Aged Out   HPV VACCINES  Aged Out   Meningococcal B Vaccine  Aged Out    Health Maintenance  Health Maintenance Due  Topic Date Due   OPHTHALMOLOGY EXAM  Never done   Zoster Vaccines- Shingrix (1 of 2) Never done   COVID-19 Vaccine (1 - 2024-25 season) Never done   HEMOGLOBIN A1C  07/06/2023   FOOT EXAM  09/20/2023   Diabetic kidney evaluation - Urine ACR  01/03/2024    Colorectal cancer screening: No longer required.   Mammogram status: No longer required due to age.  Bone Density status: Completed 08/04/2021. Results reflect: Bone density results: OSTEOPOROSIS. Repeat every 2 years.  Lung Cancer Screening: (Low Dose CT Chest recommended if Age 64-80 years, 20 pack-year currently smoking OR have quit w/in 15years.) does not qualify.   Lung Cancer Screening Referral: n/a  Additional Screening:  Hepatitis C Screening: does not qualify; Completed   Vision Screening: Recommended annual ophthalmology exams for early detection of glaucoma and other disorders of the eye. Is the patient up to date with their annual eye exam?  No  Who is the provider or what is the name of the office in which the patient attends annual eye exams?  If pt is not established with a provider, would  they like to be referred to a provider to establish care? No .   Dental Screening: Recommended annual dental exams for proper oral hygiene  Diabetic Foot Exam: Diabetic Foot Exam: Overdue, Pt has been advised about the importance in completing this exam. Pt is scheduled for diabetic foot exam on August.  Community Resource Referral / Chronic Care Management: CRR required this visit?  No   CCM required this visit?  No     Plan:     I have personally reviewed and noted the following in the patient's chart:   Medical and social history Use of alcohol, tobacco or illicit drugs  Current medications and supplements including opioid prescriptions. Patient is not currently taking opioid  prescriptions. Functional ability and status Nutritional status Physical activity Advanced directives List of other physicians Hospitalizations, surgeries, and ER visits in previous 12 months. None Vitals Screenings to include cognitive, depression, and falls Referrals and appointments  In addition, I have reviewed and discussed with patient certain preventive protocols, quality metrics, and best practice recommendations. A written personalized care plan for preventive services as well as general preventive health recommendations were provided to patient.     Bonny Jon Mayor, CMA   12/07/2023   After Visit Summary: (MyChart) Due to this being a telephonic visit, the after visit summary with patients personalized plan was offered to patient via MyChart   Nurse Notes:    Kimberly Hines is a 83 y.o. female patient of Metheney, Dorothyann BIRCH, MD who had a Medicare Annual Wellness Visit today via telephone. Rye is Retired and lives alone. she has 2 children. She reports that she is socially active and does interact with friends/family regularly. She is not as active as she has been in the past.

## 2023-12-26 ENCOUNTER — Ambulatory Visit: Admitting: Family Medicine

## 2023-12-27 ENCOUNTER — Ambulatory Visit (INDEPENDENT_AMBULATORY_CARE_PROVIDER_SITE_OTHER): Admitting: Family Medicine

## 2023-12-27 VITALS — BP 133/64 | HR 89 | Ht 62.0 in | Wt 146.1 lb

## 2023-12-27 DIAGNOSIS — F419 Anxiety disorder, unspecified: Secondary | ICD-10-CM

## 2023-12-27 DIAGNOSIS — F32A Depression, unspecified: Secondary | ICD-10-CM | POA: Diagnosis not present

## 2023-12-27 DIAGNOSIS — I10 Essential (primary) hypertension: Secondary | ICD-10-CM

## 2023-12-27 DIAGNOSIS — E119 Type 2 diabetes mellitus without complications: Secondary | ICD-10-CM | POA: Diagnosis not present

## 2023-12-27 DIAGNOSIS — G3184 Mild cognitive impairment, so stated: Secondary | ICD-10-CM

## 2023-12-27 LAB — POCT GLYCOSYLATED HEMOGLOBIN (HGB A1C): Hemoglobin A1C: 14.8 % — AB (ref 4.0–5.6)

## 2023-12-27 MED ORDER — ATORVASTATIN CALCIUM 40 MG PO TABS: 40.0000 mg | ORAL_TABLET | Freq: Every day | ORAL | 3 refills | Status: AC

## 2023-12-27 MED ORDER — EMPAGLIFLOZIN 10 MG PO TABS
10.0000 mg | ORAL_TABLET | Freq: Every day | ORAL | 1 refills | Status: AC
Start: 2023-12-27 — End: ?

## 2023-12-27 MED ORDER — MEMANTINE HCL 10 MG PO TABS: 10.0000 mg | ORAL_TABLET | Freq: Two times a day (BID) | ORAL | 1 refills | Status: AC

## 2023-12-27 MED ORDER — METFORMIN HCL ER 500 MG PO TB24: 500.0000 mg | ORAL_TABLET | Freq: Every day | ORAL | 1 refills | Status: AC

## 2023-12-27 MED ORDER — FLUOXETINE HCL 20 MG PO TABS: 20.0000 mg | ORAL_TABLET | Freq: Every day | ORAL | 0 refills | Status: AC

## 2023-12-27 NOTE — Assessment & Plan Note (Addendum)
 Symptoms have progressed PHQ-9 score of 27.  GAD-7 score of 20.  Will stop the wellbutrin  and after one week then wil cut the fluoxetine  to 20mg .  That way when I see her back in 3 months if she still doing well and there is been no major shift then we will going to completely drop with the fluoxetine .  She has a mentality right now that since she is not able to try that her life is really moved for.  We Oertli could consider palliative care.

## 2023-12-27 NOTE — Assessment & Plan Note (Signed)
 Well controlled. Continue current regimen. Follow up in  6 mo

## 2023-12-27 NOTE — Assessment & Plan Note (Signed)
 She did have about a 2 point drop on her MoCA today compared to last fall.  Will increase Namenda  to 10 mg overall although I think she is fairly stable she is still living at home but no longer driving and her daughter goes over and checks on her almost every day and her grandson comes over and checks on her usually once or twice a week as well.  Continue with rivastigmine  as well.

## 2023-12-27 NOTE — Assessment & Plan Note (Signed)
 A1c went up significantly to 14.8 from 8.2.  We discussed some options.  Will refill the Jardiance  and add metformin  last renal function was stable.

## 2023-12-27 NOTE — Progress Notes (Signed)
 Established Patient Office Visit  Subjective  Patient ID: Kimberly Hines, female    DOB: 08/16/1940  Age: 83 y.o. MRN: [id number]  Chief Complaint  Patient presents with   Hypertension    HPI  Here today for follow-up diabetes-she is currently on Jardiance  10 mg.  Hypertension- Pt denies chest pain, SOB, dizziness, or heart palpitations.  Taking meds as directed w/o problems.  Denies medication side effects.    She is also here for follow-up for mild cognitive impairment-she is currently on amantadine 5 mg twice a day and rivastigmine  6 mg capsule twice a day.  She reports that she has been feeling a lot more anxious and down lately and her daughter confirms that.  She is currently on fluoxetine  40 mg daily.  Also on Wellbutrin  150 mg.     ROS    Objective:     BP 133/64   Pulse 89   Ht 5' 2 (1.575 m)   Wt 146 lb 1.9 oz (66.3 kg)   SpO2 98%   BMI 26.73 kg/m    Physical Exam Vitals and nursing note reviewed.  Constitutional:      Appearance: Normal appearance.  HENT:     Head: Normocephalic and atraumatic.  Eyes:     Conjunctiva/sclera: Conjunctivae normal.  Cardiovascular:     Rate and Rhythm: Normal rate and regular rhythm.  Pulmonary:     Effort: Pulmonary effort is normal.     Breath sounds: Normal breath sounds.  Skin:    General: Skin is warm and dry.  Neurological:     Mental Status: She is alert.  Psychiatric:        Mood and Affect: Mood normal.      Results for orders placed or performed in visit on 12/27/23  POCT HgB A1C  Result Value Ref Range   Hemoglobin A1C 14.8 (A) 4.0 - 5.6 %   HbA1c POC (<> result, manual entry)     HbA1c, POC (prediabetic range)     HbA1c, POC (controlled diabetic range)        The ASCVD Risk score (Arnett DK, et al., 2019) failed to calculate for the following reasons:   The 2019 ASCVD risk score is only valid for ages 38 to 22    Assessment & Plan:   Problem List Items Addressed This Visit        Cardiovascular and Mediastinum   Hypertension   Well controlled. Continue current regimen. Follow up in  37mo       Relevant Medications   atorvastatin  (LIPITOR) 40 MG tablet   Other Relevant Orders   CMP14+EGFR   CBC with Differential/Platelet   TSH   VITAMIN D  25 Hydroxy (Vit-D Deficiency, Fractures)   Lipid panel     Endocrine   Type 2 diabetes mellitus without complication, without long-term current use of insulin (HCC) - Primary   A1c went up significantly to 14.8 from 8.2.  We discussed some options.  Will refill the Jardiance  and add metformin  last renal function was stable.      Relevant Medications   metFORMIN  (GLUCOPHAGE -XR) 500 MG 24 hr tablet   empagliflozin  (JARDIANCE ) 10 MG TABS tablet   atorvastatin  (LIPITOR) 40 MG tablet   Other Relevant Orders   POCT HgB A1C (Completed)   CMP14+EGFR   CBC with Differential/Platelet   TSH   VITAMIN D  25 Hydroxy (Vit-D Deficiency, Fractures)   Lipid panel   Urine Microalbumin w/creat. ratio     Other  Mild cognitive impairment of uncertain or unknown etiology   She did have about a 2 point drop on her MoCA today compared to last fall.  Will increase Namenda  to 10 mg overall although I think she is fairly stable she is still living at home but no longer driving and her daughter goes over and checks on her almost every day and her grandson comes over and checks on her usually once or twice a week as well.  Continue with rivastigmine  as well.      Relevant Medications   memantine  (NAMENDA ) 10 MG tablet   Other Relevant Orders   CMP14+EGFR   CBC with Differential/Platelet   TSH   VITAMIN D  25 Hydroxy (Vit-D Deficiency, Fractures)   Lipid panel   Anxiety and depression   Symptoms have progressed PHQ-9 score of 27.  GAD-7 score of 20.  Will stop the wellbutrin  and after one week then wil cut the fluoxetine  to 20mg .  That way when I see her back in 3 months if she still doing well and there is been no major shift then we will  going to completely drop with the fluoxetine .  She has a mentality right now that since she is not able to try that her life is really moved for.  We Oertli could consider palliative care.      Relevant Medications   FLUoxetine  (PROZAC ) 20 MG tablet   Other Relevant Orders   CMP14+EGFR   CBC with Differential/Platelet   TSH   VITAMIN D  25 Hydroxy (Vit-D Deficiency, Fractures)   Lipid panel    Return in about 4 weeks (around 01/24/2024) for Diabetes follow-up.    I spent 40 minutes on the day of the encounter to include pre-visit record review, face-to-face time with the patient and post visit ordering of test.   [name], MD

## 2023-12-27 NOTE — Patient Instructions (Addendum)
 Please check blood sugar 1-2 times per week if possible and write that number down on a sheet to bring in with you at the next visit. Try to really cut back on your fruit intake. Cut back on sugar and ice cream. Please try to drink more water.

## 2023-12-28 ENCOUNTER — Ambulatory Visit: Payer: Self-pay | Admitting: Family Medicine

## 2023-12-28 DIAGNOSIS — E559 Vitamin D deficiency, unspecified: Secondary | ICD-10-CM

## 2023-12-28 LAB — LIPID PANEL
Chol/HDL Ratio: 3.1 ratio (ref 0.0–4.4)
Cholesterol, Total: 141 mg/dL (ref 100–199)
HDL: 45 mg/dL (ref >39)
LDL Chol Calc (NIH): 69 mg/dL (ref 0–99)
Triglycerides: 155 mg/dL — ABNORMAL HIGH (ref 0–149)
VLDL Cholesterol Cal: 27 mg/dL (ref 5–40)

## 2023-12-28 LAB — CMP14+EGFR
ALT: 29 IU/L (ref 0–32)
AST: 21 IU/L (ref 0–40)
Albumin: 4.2 g/dL (ref 3.7–4.7)
Alkaline Phosphatase: 217 IU/L — ABNORMAL HIGH (ref 44–121)
BUN/Creatinine Ratio: 12 (ref 12–28)
BUN: 9 mg/dL (ref 8–27)
Bilirubin Total: 0.7 mg/dL (ref 0.0–1.2)
CO2: 19 mmol/L — ABNORMAL LOW (ref 20–29)
Calcium: 10.1 mg/dL (ref 8.7–10.3)
Chloride: 96 mmol/L (ref 96–106)
Creatinine, Ser: 0.78 mg/dL (ref 0.57–1.00)
Globulin, Total: 2.4 g/dL (ref 1.5–4.5)
Glucose: 352 mg/dL — ABNORMAL HIGH (ref 70–99)
Potassium: 3.1 mmol/L — ABNORMAL LOW (ref 3.5–5.2)
Sodium: 134 mmol/L (ref 134–144)
Total Protein: 6.6 g/dL (ref 6.0–8.5)
eGFR: 76 mL/min/1.73 (ref >59)

## 2023-12-28 LAB — CBC WITH DIFFERENTIAL/PLATELET
Basophils Absolute: 0 x10E3/uL (ref 0.0–0.2)
Basos: 0 %
EOS (ABSOLUTE): 0.1 x10E3/uL (ref 0.0–0.4)
Eos: 1 %
Hematocrit: 50.5 % — ABNORMAL HIGH (ref 34.0–46.6)
Hemoglobin: 16.2 g/dL — ABNORMAL HIGH (ref 11.1–15.9)
Immature Grans (Abs): 0 x10E3/uL (ref 0.0–0.1)
Immature Granulocytes: 0 %
Lymphocytes Absolute: 1.8 x10E3/uL (ref 0.7–3.1)
Lymphs: 19 %
MCH: 29.1 pg (ref 26.6–33.0)
MCHC: 32.1 g/dL (ref 31.5–35.7)
MCV: 91 fL (ref 79–97)
Monocytes Absolute: 0.7 x10E3/uL (ref 0.1–0.9)
Monocytes: 8 %
Neutrophils Absolute: 6.9 x10E3/uL (ref 1.4–7.0)
Neutrophils: 72 %
Platelets: 308 x10E3/uL (ref 150–450)
RBC: 5.57 x10E6/uL — ABNORMAL HIGH (ref 3.77–5.28)
RDW: 12.6 % (ref 11.7–15.4)
WBC: 9.5 x10E3/uL (ref 3.4–10.8)

## 2023-12-28 LAB — MICROALBUMIN / CREATININE URINE RATIO
Creatinine, Urine: 22.5 mg/dL
Microalb/Creat Ratio: 32 mg/g{creat} — ABNORMAL HIGH (ref 0–29)
Microalbumin, Urine: 7.1 ug/mL

## 2023-12-28 LAB — VITAMIN D 25 HYDROXY (VIT D DEFICIENCY, FRACTURES): Vit D, 25-Hydroxy: 27.7 ng/mL — ABNORMAL LOW (ref 30.0–100.0)

## 2023-12-28 LAB — SPECIMEN STATUS REPORT

## 2023-12-28 LAB — TSH: TSH: 1.9 u[IU]/mL (ref 0.450–4.500)

## 2023-12-28 NOTE — Progress Notes (Signed)
 Call daughter [name] with results.  Microalbumin shows just a little excess protein in the urine we will keep an eye on this.  Potassium level was still a little low.  Can you just double check that she is taking her potassium daily if she is then we may need to make an adjustment.  Glucose was definitely over 300.  So I think we had discussed some changes yesterday to get this under better control.  Alkaline phosphatase which is a liver enzyme is high.  I would like to recheck that in about 2 weeks after getting the blood sugars down to see if that is improving.  Hemoglobin is elevated at 16.2.  Normal baseline is around 14.  Just make sure she is staying hydrated.  Will recheck that in 2 weeks as well.  Vitamin D  is low recommend 25 mcg daily they do make some chewables that might be a good option.  Total cholesterol and LDL look good.  Thyroid  is normal.

## 2023-12-30 ENCOUNTER — Other Ambulatory Visit: Payer: Self-pay | Admitting: Family Medicine

## 2023-12-30 DIAGNOSIS — R7989 Other specified abnormal findings of blood chemistry: Secondary | ICD-10-CM

## 2023-12-30 MED ORDER — VITAMIN D (ERGOCALCIFEROL) 1.25 MG (50000 UNIT) PO CAPS: 50000.0000 [IU] | ORAL_CAPSULE | ORAL | 1 refills | Status: AC

## 2023-12-30 NOTE — Progress Notes (Signed)
Meds ordered this encounter  Medications  . Vitamin D, Ergocalciferol, (DRISDOL) 1.25 MG (50000 UNIT) CAPS capsule    Sig: Take 1 capsule (50,000 Units total) by mouth every 7 (seven) days.    Dispense:  12 capsule    Refill:  1

## 2023-12-30 NOTE — Progress Notes (Signed)
 I will send over a prescription vitam in D to try and then recheck in 3 months.

## 2024-01-05 ENCOUNTER — Other Ambulatory Visit: Payer: Self-pay | Admitting: Family Medicine

## 2024-02-02 ENCOUNTER — Ambulatory Visit: Admitting: Family Medicine

## 2024-02-02 NOTE — Progress Notes (Deleted)
   Established Patient Office Visit  Subjective  Patient ID: ALONAH LINEBACK, female    DOB: 10/13/1940  Age: 83 y.o. MRN: [id number]  No chief complaint on file.   HPI F/U HTN, DM.  She is here for 6-week follow-up because her last A1c was 14 after having been under fair control.  In addition her alkaline phosphatase had jumped up significantly into the 200 range.  I had asked her to come back in 2 weeks to have that repeated so it has been a little longer than that.  She is now on Jardiance  and metformin .   {History (Optional):23778}  ROS    Objective:     There were no vitals taken for this visit. {Vitals History (Optional):23777}  Physical Exam   No results found for any visits on 02/02/24.  {Labs (Optional):23779}  The ASCVD Risk score (Arnett DK, et al., 2019) failed to calculate for the following reasons:   The 2019 ASCVD risk score is only valid for ages 68 to 42    Assessment & Plan:   Problem List Items Addressed This Visit       Cardiovascular and Mediastinum   Hypertension - Primary     Endocrine   Type 2 diabetes mellitus without complication, without long-term current use of insulin (HCC)     Other   Elevated alkaline phosphatase level    No follow-ups on file.    [name], MD

## 2024-04-13 ENCOUNTER — Telehealth: Payer: Self-pay

## 2024-04-13 NOTE — Telephone Encounter (Signed)
 Copied from CRM [id number]. Topic: General - Other >> Apr 13, 2024 10:14 AM Joesph NOVAK wrote: Reason for CRM: Patients daughter is calling to inform Dr.Metheney that patient is going to be living at [facility]  [facility] will be sending a request for patients info and shes giving Dr.Metheney permission to give this information.

## 2024-04-18 ENCOUNTER — Telehealth: Payer: Self-pay | Admitting: Family Medicine

## 2024-04-18 NOTE — Telephone Encounter (Signed)
 Form completed for [place] 2.  Will fax fax this evening

## 2024-04-23 ENCOUNTER — Telehealth: Payer: Self-pay | Admitting: Family Medicine

## 2024-04-23 NOTE — Telephone Encounter (Signed)
 Copied from CRM #[id number]. Topic: General - Other >> Apr 23, 2024 12:18 PM Selinda RAMAN wrote: Reason for CRM: [name] Mody the daughter of the patient called in checking on the status of a previous request from [facility] concerning her mothers medical records that have been requested. She states they are trying to get her in to this facility and time is of the essence. The faxed form request is on file and the fax number that the records need to go to is listed. Please assist as soon as possible and let her know when they have been sent. She is very adult nurse.

## 2024-04-27 NOTE — Telephone Encounter (Signed)
 This has been addressed.

## 2024-04-30 ENCOUNTER — Other Ambulatory Visit: Payer: Self-pay | Admitting: Family Medicine

## 2024-04-30 DIAGNOSIS — G3184 Mild cognitive impairment, so stated: Secondary | ICD-10-CM

## 2024-04-30 DIAGNOSIS — I480 Paroxysmal atrial fibrillation: Secondary | ICD-10-CM

## 2024-05-18 ENCOUNTER — Ambulatory Visit: Payer: Self-pay

## 2024-05-18 NOTE — Telephone Encounter (Signed)
 FYI Only or Action Required?: Action required by provider: cough medication request.  Patient was last seen in primary care on 12/27/2023 by Kimberly Dorothyann BIRCH, MD.  Called Nurse Triage reporting Cough.  Symptoms began several days ago.  Interventions attempted: Prescription medications: cough syrup with codeine from 2024 rx.  Symptoms are: unchanged.  Triage Disposition: Home Care  Patient/caregiver understands and will follow disposition?: No, wishes to speak with PCP   Reason for Disposition  Cough  Answer Assessment - Initial Assessment Questions Additional info: Patient daughter called to request cough medicine with Codeine as prescribed last year. Daughter states that patient has bronchial cough every year around this time and treats at home with cough medicines and rest. She overall feels well with exception of cough and mild headache from coughing. She did have a small amount of cough medicine with codeine left from last year and has administered one dose. Daughter states she is aware no appointments in clinic today and they do not want to go to urgent care, requesting cough medicine to pharmacy. Advised patient can try OTC Robitussin long acting while awaiting pcp response.  No appointments available in clinic today.  Please follow up with daughter [name] [phone]  1. ONSET: When did the cough begin?      Few days ago  2. SEVERITY: How bad is the cough today?      strong 3. SPUTUM: Describe the color of your sputum (e.g., none, dry cough; clear, white, yellow, green)     dry 4. HEMOPTYSIS: Are you coughing up any blood? If Yes, ask: How much? (e.g., flecks, streaks, tablespoons, etc.)     denies 5. DIFFICULTY BREATHING: Are you having difficulty breathing? If Yes, ask: How bad is it? (e.g., mild, moderate, severe)      Denies  6. FEVER: Do you have a fever? If Yes, ask: What is your temperature, how was it measured, and when did it start?      denies 7. CARDIAC HISTORY: Do you have any history of heart disease? (e.g., heart attack, congestive heart failure)      Denies  8. LUNG HISTORY: Do you have any history of lung disease?  (e.g., pulmonary embolus, asthma, emphysema)      9. PE RISK FACTORS: Do you have a history of blood clots? (or: recent major surgery, recent prolonged travel, bedridden)      10. OTHER SYMPTOMS: Do you have any other symptoms? (e.g., runny nose, wheezing, chest pain)       Headache from cough.  11. PREGNANCY: Is there any chance you are pregnant? When was your last menstrual period?        12. TRAVEL: Have you traveled out of the country in the last month? (e.g., travel history, exposures)  Protocols used: Cough - Acute Non-Productive-A-AH Message from Kevelyn M sent at 05/18/2024 12:56 PM EST  Reason for Triage: Patient's daughter calling in stating that patient has a really bad cough that got progressively worse over the last few days. She's coughing so hard she has headache.  Call back # [phone]

## 2024-05-18 NOTE — Telephone Encounter (Signed)
 Spoke with patient daughter [name]-  patient was scheduled for Monday 05/21/2024 with Zada Palin, NP -  She will take the patient to urgent care over the weekend if cough worsens.  She was cautioned on giving the patient the old cough medication with codeine due to it sedating effects.

## 2024-05-20 NOTE — Progress Notes (Deleted)

## 2024-05-21 ENCOUNTER — Ambulatory Visit: Admitting: Medical-Surgical

## 2024-05-25 ENCOUNTER — Telehealth: Payer: Self-pay

## 2024-05-25 NOTE — Telephone Encounter (Signed)
 Let's for letter. She wil have to get copy of POA from her mom ( not us )

## 2024-05-25 NOTE — Telephone Encounter (Signed)
 I do not see a POA form in her chart.

## 2024-05-25 NOTE — Telephone Encounter (Signed)
 Copied from CRM #[id number]. Topic: General - Other >> May 25, 2024  1:58 PM Lauren C wrote: Reason for CRM: Daughter Kimberly Hines (on dpr) is calling to see if they can get a letter for their prospective assisted living facility that states that the pt was of sound mind and dementia had not progressed when Kimberly Hines signed a form for Kimberly to be her power of attorney in April of 2024. Kimberly would like to pick up a physical copy of this letter. Please let her know once complete at [phone] They are holding a room for her at [facility] and this is last thing needed so hoping for this to be done as soon as possible.

## 2024-05-28 NOTE — Telephone Encounter (Signed)
 Attempted call to patient daughter [name] - phone seemed to answer but no response from other end of line.

## 2024-05-29 ENCOUNTER — Encounter: Payer: Self-pay | Admitting: Family Medicine

## 2024-05-29 ENCOUNTER — Ambulatory Visit: Admitting: Family Medicine

## 2024-05-29 VITALS — BP 130/58 | HR 68 | Ht 62.0 in | Wt 138.0 lb

## 2024-05-29 DIAGNOSIS — I4891 Unspecified atrial fibrillation: Secondary | ICD-10-CM

## 2024-05-29 DIAGNOSIS — F322 Major depressive disorder, single episode, severe without psychotic features: Secondary | ICD-10-CM | POA: Insufficient documentation

## 2024-05-29 DIAGNOSIS — I10 Essential (primary) hypertension: Secondary | ICD-10-CM | POA: Diagnosis not present

## 2024-05-29 DIAGNOSIS — E119 Type 2 diabetes mellitus without complications: Secondary | ICD-10-CM

## 2024-05-29 DIAGNOSIS — Z23 Encounter for immunization: Secondary | ICD-10-CM | POA: Diagnosis not present

## 2024-05-29 DIAGNOSIS — I152 Hypertension secondary to endocrine disorders: Secondary | ICD-10-CM

## 2024-05-29 DIAGNOSIS — E1159 Type 2 diabetes mellitus with other circulatory complications: Secondary | ICD-10-CM | POA: Diagnosis not present

## 2024-05-29 DIAGNOSIS — E876 Hypokalemia: Secondary | ICD-10-CM | POA: Diagnosis not present

## 2024-05-29 LAB — POCT GLYCOSYLATED HEMOGLOBIN (HGB A1C): Hemoglobin A1C: 13 % — AB (ref 4.0–5.6)

## 2024-05-29 MED ORDER — LANCETS MISC: 1.0000 | 3 refills | Status: AC

## 2024-05-29 MED ORDER — BLOOD GLUCOSE MONITORING SUPPL DEVI: 1.0000 | Freq: Two times a day (BID) | 0 refills | Status: AC | PRN

## 2024-05-29 MED ORDER — LANCET DEVICE MISC: 1.0000 | 1 refills | Status: AC

## 2024-05-29 MED ORDER — BLOOD GLUCOSE TEST VI STRP: ORAL_STRIP | 3 refills | Status: AC

## 2024-05-29 MED ORDER — TOUJEO SOLOSTAR 300 UNIT/ML ~~LOC~~ SOPN: 10.0000 [IU] | PEN_INJECTOR | Freq: Every evening | SUBCUTANEOUS | 1 refills | Status: AC

## 2024-05-29 NOTE — Assessment & Plan Note (Signed)
Recheck electrolytes 

## 2024-05-29 NOTE — Assessment & Plan Note (Addendum)
 Currently on proza. Still scoring on high on PHQ- 9 , again not consistent with medications. Moving to nursing home may help with that.

## 2024-05-29 NOTE — Progress Notes (Signed)
 "  Established Patient Office Visit  Patient ID: Kimberly Hines, female    DOB: [birth year]  Age: 84 y.o. MRN: [id number] PCP: [name], MD  Chief Complaint  Patient presents with   Hypertension   Diabetes    Subjective:     HPI  Discussed the use of AI scribe software for clinical note transcription with the patient, who gave verbal consent to proceed.  History of Present Illness Kimberly Hines is an 84 year old female with diabetes who presents with a recent fall and elevated blood sugar levels. She is accompanied by her daughter, who is her primary caregiver.  Fall and head injury - Recent fall with unclear mechanism of injury - Tender spot, bump, and bruising on the head - No recall of how the fall occurred - Increased frequency of falls observed by caregiver - No headaches  Hyperglycemia and diabetes management - Blood glucose measured at 511 mg/dL in the emergency room - Inconsistent medication adherence, particularly with morning metformin  doses - Difficulty with independent medication and diet management - Dietary challenges despite attempts to limit sugar intake, such as portioning ice cream - Symptoms of hyperglycemia including vision changes and weakness  Visual disturbances - Vision fluctuates with blood glucose levels - No recent eye examination  Laboratory abnormalities - Abnormal sodium, chloride, glucose, and white blood cell count on recent blood work     ROS    Objective:     BP (!) 130/58   Pulse 68   Ht 5' 2 (1.575 m)   Wt 138 lb (62.6 kg)   SpO2 98%   BMI 25.24 kg/m    Physical Exam Vitals and nursing note reviewed.  Constitutional:      Appearance: Normal appearance.  HENT:     Head: Normocephalic and atraumatic.  Eyes:     Conjunctiva/sclera: Conjunctivae normal.  Cardiovascular:     Rate and Rhythm: Normal rate and regular rhythm.  Pulmonary:     Effort: Pulmonary effort is normal.     Breath sounds: Normal  breath sounds.  Skin:    General: Skin is warm and dry.     Comments: Small hematoma on left scalp.   Neurological:     Mental Status: She is alert.  Psychiatric:        Mood and Affect: Mood normal.      Results for orders placed or performed in visit on 05/29/24  POCT HgB A1C  Result Value Ref Range   Hemoglobin A1C 13.0 (A) 4.0 - 5.6 %   HbA1c POC (<> result, manual entry)     HbA1c, POC (prediabetic range)     HbA1c, POC (controlled diabetic range)        The ASCVD Risk score (Arnett DK, et al., 2019) failed to calculate for the following reasons:   The 2019 ASCVD risk score is only valid for ages 63 to 78   * - Cholesterol units were assumed    Assessment & Plan:   Problem List Items Addressed This Visit       Cardiovascular and Mediastinum   Hypertension associated with diabetes (HCC) - Primary   BP at goal today.       Relevant Medications   insulin glargine , 1 Unit Dial, (TOUJEO  SOLOSTAR) 300 UNIT/ML Solostar Pen   Blood Glucose Monitoring Suppl DEVI   Glucose Blood (BLOOD GLUCOSE TEST STRIPS) STRP   Lancet Device MISC   Lancets MISC     Other   Severe  major depressive disorder (HCC)   Currently on proza. Still scoring on high on PHQ- 9 , again not consistent with medications. Moving to nursing home may help with that.       Hypokalemia   Recheck electrolytes      Relevant Orders   CMP14+EGFR   CBC with Differential/Platelet   Other Visit Diagnoses       Atrial fibrillation with RVR (HCC)         Type 2 diabetes mellitus without complication, without long-term current use of insulin (HCC)       Relevant Medications   insulin glargine , 1 Unit Dial, (TOUJEO  SOLOSTAR) 300 UNIT/ML Solostar Pen   Other Relevant Orders   POCT HgB A1C (Completed)     Encounter for immunization       Relevant Orders   Flu vaccine HIGH DOSE PF(Fluzone Trivalent) (Completed)       Assessment and Plan Assessment & Plan Type 2 diabetes mellitus with  hyperglycemia Severe hyperglycemia with glucose >500 mg/dL, contributing to falls. Inconsistent medication adherence and high sugar intake noted. Previous A1c was 14%. - Initiated insulin therapy with 10 units nightly using an insulin pen. - Instructed to check glucose levels before administering insulin. - Increase insulin dose by 2 units every two nights if glucose >200 mg/dL. - Prescribed new glucose meter and strips covered by insurance. - Encouraged reduction of sugar intake and adherence to oral medications.  Electrolyte abnormalities (hypokalemia, hyponatremia, hypochloremia) Electrolyte imbalances likely secondary to chronic hyperglycemia. - Rechecked electrolyte levels to monitor for improvement.  Fall with head injury and scalp contusion Scalp contusion and tenderness from recent fall. Frequent falls likely due to hyperglycemia affecting vision and balance.  General health maintenance Discussed flu vaccination and eye exam scheduling. Encouraged flu vaccine due to current flu season and potential assisted living transition. - Administered flu vaccine today. - Schedule eye exam for retinal check-up, ideally in the spring.    Return in about 7 weeks (around 07/17/2024) for Diabetes follow-up.    [name], MD [facility]   "

## 2024-05-29 NOTE — Assessment & Plan Note (Signed)
 BP at goal today.

## 2024-05-29 NOTE — Patient Instructions (Signed)
 Start with giving 10 units of Toujeo  each evening.  After 2 nights if glucose > 200 go up to 12 units After 2 nights if glucose > 200 go up to 14 units Keep going up until glucose under 200.    Let me know how many units using in case I need to update her prescription.

## 2024-05-30 ENCOUNTER — Ambulatory Visit: Payer: Self-pay | Admitting: Family Medicine

## 2024-05-30 LAB — CMP14+EGFR
ALT: 22 IU/L (ref 0–32)
AST: 19 IU/L (ref 0–40)
Albumin: 3.8 g/dL (ref 3.7–4.7)
Alkaline Phosphatase: 145 IU/L — ABNORMAL HIGH (ref 48–129)
BUN/Creatinine Ratio: 27 (ref 12–28)
BUN: 21 mg/dL (ref 8–27)
Bilirubin Total: 0.7 mg/dL (ref 0.0–1.2)
CO2: 20 mmol/L (ref 20–29)
Calcium: 10.1 mg/dL (ref 8.7–10.3)
Chloride: 98 mmol/L (ref 96–106)
Creatinine, Ser: 0.78 mg/dL (ref 0.57–1.00)
Globulin, Total: 2.7 g/dL (ref 1.5–4.5)
Glucose: 273 mg/dL — ABNORMAL HIGH (ref 70–99)
Potassium: 2.8 mmol/L — ABNORMAL LOW (ref 3.5–5.2)
Sodium: 137 mmol/L (ref 134–144)
Total Protein: 6.5 g/dL (ref 6.0–8.5)
eGFR: 76 mL/min/1.73

## 2024-05-30 LAB — CBC WITH DIFFERENTIAL/PLATELET
Basophils Absolute: 0.1 x10E3/uL (ref 0.0–0.2)
Basos: 1 %
EOS (ABSOLUTE): 0.2 x10E3/uL (ref 0.0–0.4)
Eos: 2 %
Hematocrit: 47.8 % — ABNORMAL HIGH (ref 34.0–46.6)
Hemoglobin: 15.8 g/dL (ref 11.1–15.9)
Immature Grans (Abs): 0 x10E3/uL (ref 0.0–0.1)
Immature Granulocytes: 0 %
Lymphocytes Absolute: 2.2 x10E3/uL (ref 0.7–3.1)
Lymphs: 20 %
MCH: 29.6 pg (ref 26.6–33.0)
MCHC: 33.1 g/dL (ref 31.5–35.7)
MCV: 90 fL (ref 79–97)
Monocytes Absolute: 0.8 x10E3/uL (ref 0.1–0.9)
Monocytes: 8 %
Neutrophils Absolute: 7.5 x10E3/uL — ABNORMAL HIGH (ref 1.4–7.0)
Neutrophils: 69 %
Platelets: 310 x10E3/uL (ref 150–450)
RBC: 5.34 x10E6/uL — ABNORMAL HIGH (ref 3.77–5.28)
RDW: 12.7 % (ref 11.7–15.4)
WBC: 10.7 x10E3/uL (ref 3.4–10.8)

## 2024-05-30 NOTE — Progress Notes (Signed)
 Please call patient's daughter Kimberly Hines and let her know that her potassium is still quite low we need to get that up.  The alkaline phosphatase has come down quite a bit still a little elevated but it does look much better compared to 5 months ago.  And glucose is 273 so looks like it is trending down which is great.  Is she currently taking her potassium daily?  Does she need a refill?  Am happy to send over new updated prescription if needed.  Would like her to start taking 1 a day and then we can always recheck her potassium in about 3 weeks.

## 2024-05-31 ENCOUNTER — Encounter: Payer: Self-pay | Admitting: Family Medicine

## 2024-05-31 NOTE — Telephone Encounter (Signed)
 Letter has been written and placed at front desk for [facility] pick up.

## 2024-05-31 NOTE — Progress Notes (Signed)
 Patient daughter GLENWOOD Planas - informed and will increase potassium to twice daily

## 2024-05-31 NOTE — Progress Notes (Signed)
 OK please tell her daughter ot increase to twice a day if possible.

## 2024-05-31 NOTE — Telephone Encounter (Unsigned)
 Copied from CRM [id number]. Topic: Clinical - Lab/Test Results >> May 30, 2024  4:38 PM Tobias CROME wrote: Reason for CRM: Relayed results to daughter. Daughter confirms patient is taking her potassium daily. Using OTC potassium.   No further questions.

## 2024-06-08 ENCOUNTER — Telehealth: Payer: Self-pay

## 2024-06-08 NOTE — Telephone Encounter (Signed)
 Contacted the patient and spoke to her daughter [name]. Offered an appointment for her mother on Monday. She declined to schedule the appointment. [name] will get a OTC cough medication for her mother. If the patient does not improve over the weekend she will call the clinic on Monday to get the patient scheduled as needed. She was made aware that available appointment slots fills quickly. Verbalized understanding.

## 2024-06-08 NOTE — Telephone Encounter (Signed)
 Copied from CRM [id number]. Topic: Clinical - Medical Advice >> Jun 08, 2024  1:36 PM Mercer PEDLAR wrote: Reason for CRM: [name] (daughter) stated that patient is having a dry cough and is wondering if her PCP is able to send in a prescription to [facility].  [facility] - [place], [place] - [address] [place] [address] [place] [address] Phone: [phone] Fax: [phone]

## 2024-06-08 NOTE — Telephone Encounter (Signed)
 Needs appointment

## 2024-07-17 ENCOUNTER — Ambulatory Visit: Admitting: Family Medicine

## 2024-12-11 ENCOUNTER — Ambulatory Visit
